# Patient Record
Sex: Female | Born: 1960 | Race: White | Hispanic: No | Marital: Married | Smoking: Never smoker
Health system: Southern US, Community
[De-identification: ages and names within clinical notes are randomized; demographics above are authoritative.]

## PROBLEM LIST (undated history)

## (undated) DIAGNOSIS — H9201 Otalgia, right ear: Secondary | ICD-10-CM

## (undated) DIAGNOSIS — C4491 Basal cell carcinoma of skin, unspecified: Secondary | ICD-10-CM

## (undated) DIAGNOSIS — K589 Irritable bowel syndrome without diarrhea: Secondary | ICD-10-CM

## (undated) DIAGNOSIS — R232 Flushing: Secondary | ICD-10-CM

## (undated) DIAGNOSIS — M1990 Unspecified osteoarthritis, unspecified site: Secondary | ICD-10-CM

## (undated) DIAGNOSIS — M75102 Unspecified rotator cuff tear or rupture of left shoulder, not specified as traumatic: Secondary | ICD-10-CM

## (undated) DIAGNOSIS — N200 Calculus of kidney: Secondary | ICD-10-CM

## (undated) DIAGNOSIS — A630 Anogenital (venereal) warts: Secondary | ICD-10-CM

## (undated) HISTORY — DX: Unspecified rotator cuff tear or rupture of left shoulder, not specified as traumatic: M75.102

## (undated) HISTORY — DX: Irritable bowel syndrome, unspecified: K58.9

## (undated) HISTORY — PX: APPENDECTOMY: SHX54

## (undated) HISTORY — DX: Basal cell carcinoma of skin, unspecified: C44.91

## (undated) HISTORY — DX: Flushing: R23.2

## (undated) HISTORY — DX: Otalgia, right ear: H92.01

## (undated) HISTORY — PX: WISDOM TOOTH EXTRACTION: SHX21

## (undated) HISTORY — PX: BASAL CELL CARCINOMA EXCISION: SHX1214

## (undated) HISTORY — PX: ABDOMINAL HYSTERECTOMY: SHX81

## (undated) HISTORY — DX: Anogenital (venereal) warts: A63.0

## (undated) HISTORY — DX: Unspecified osteoarthritis, unspecified site: M19.90

## (undated) HISTORY — DX: Calculus of kidney: N20.0

---

## 1999-03-20 ENCOUNTER — Encounter: Admission: RE | Admit: 1999-03-20 | Discharge: 1999-03-20 | Payer: Self-pay | Admitting: Obstetrics and Gynecology

## 1999-03-20 ENCOUNTER — Encounter: Payer: Self-pay | Admitting: Obstetrics and Gynecology

## 1999-03-27 ENCOUNTER — Encounter: Payer: Self-pay | Admitting: Obstetrics and Gynecology

## 1999-03-27 ENCOUNTER — Encounter: Admission: RE | Admit: 1999-03-27 | Discharge: 1999-03-27 | Payer: Self-pay | Admitting: Obstetrics and Gynecology

## 2000-04-19 HISTORY — PX: ABDOMINAL HYSTERECTOMY: SHX81

## 2000-04-19 HISTORY — PX: APPENDECTOMY: SHX54

## 2000-05-13 ENCOUNTER — Encounter: Admission: RE | Admit: 2000-05-13 | Discharge: 2000-05-13 | Payer: Self-pay | Admitting: Obstetrics and Gynecology

## 2000-05-13 ENCOUNTER — Encounter: Payer: Self-pay | Admitting: Obstetrics and Gynecology

## 2001-05-22 ENCOUNTER — Encounter: Admission: RE | Admit: 2001-05-22 | Discharge: 2001-05-22 | Payer: Self-pay | Admitting: Obstetrics and Gynecology

## 2001-05-22 ENCOUNTER — Encounter: Payer: Self-pay | Admitting: Obstetrics and Gynecology

## 2002-05-29 ENCOUNTER — Encounter: Admission: RE | Admit: 2002-05-29 | Discharge: 2002-05-29 | Payer: Self-pay | Admitting: Obstetrics and Gynecology

## 2002-05-29 ENCOUNTER — Encounter: Payer: Self-pay | Admitting: Obstetrics and Gynecology

## 2002-09-06 ENCOUNTER — Encounter (INDEPENDENT_AMBULATORY_CARE_PROVIDER_SITE_OTHER): Payer: Self-pay | Admitting: Specialist

## 2002-09-06 ENCOUNTER — Ambulatory Visit (HOSPITAL_BASED_OUTPATIENT_CLINIC_OR_DEPARTMENT_OTHER): Admission: RE | Admit: 2002-09-06 | Discharge: 2002-09-06 | Payer: Self-pay | Admitting: Obstetrics and Gynecology

## 2002-10-31 ENCOUNTER — Inpatient Hospital Stay (HOSPITAL_COMMUNITY): Admission: RE | Admit: 2002-10-31 | Discharge: 2002-11-02 | Payer: Self-pay | Admitting: Obstetrics and Gynecology

## 2002-10-31 ENCOUNTER — Encounter (INDEPENDENT_AMBULATORY_CARE_PROVIDER_SITE_OTHER): Payer: Self-pay | Admitting: Specialist

## 2003-07-25 ENCOUNTER — Ambulatory Visit (HOSPITAL_COMMUNITY): Admission: RE | Admit: 2003-07-25 | Discharge: 2003-07-25 | Payer: Self-pay | Admitting: Obstetrics and Gynecology

## 2004-08-10 ENCOUNTER — Ambulatory Visit (HOSPITAL_COMMUNITY): Admission: RE | Admit: 2004-08-10 | Discharge: 2004-08-10 | Payer: Self-pay | Admitting: Obstetrics and Gynecology

## 2005-08-16 ENCOUNTER — Ambulatory Visit (HOSPITAL_COMMUNITY): Admission: RE | Admit: 2005-08-16 | Discharge: 2005-08-16 | Payer: Self-pay | Admitting: Obstetrics and Gynecology

## 2006-08-23 ENCOUNTER — Ambulatory Visit (HOSPITAL_COMMUNITY): Admission: RE | Admit: 2006-08-23 | Discharge: 2006-08-23 | Payer: Self-pay | Admitting: Obstetrics and Gynecology

## 2007-09-18 ENCOUNTER — Ambulatory Visit (HOSPITAL_COMMUNITY): Admission: RE | Admit: 2007-09-18 | Discharge: 2007-09-18 | Payer: Self-pay | Admitting: Obstetrics and Gynecology

## 2007-09-26 ENCOUNTER — Encounter: Admission: RE | Admit: 2007-09-26 | Discharge: 2007-09-26 | Payer: Self-pay | Admitting: Obstetrics and Gynecology

## 2007-09-27 ENCOUNTER — Encounter: Admission: RE | Admit: 2007-09-27 | Discharge: 2007-09-27 | Payer: Self-pay | Admitting: Obstetrics and Gynecology

## 2007-09-27 ENCOUNTER — Encounter (INDEPENDENT_AMBULATORY_CARE_PROVIDER_SITE_OTHER): Payer: Self-pay | Admitting: Diagnostic Radiology

## 2008-04-19 HISTORY — PX: BASAL CELL CARCINOMA EXCISION: SHX1214

## 2008-09-23 ENCOUNTER — Ambulatory Visit (HOSPITAL_COMMUNITY): Admission: RE | Admit: 2008-09-23 | Discharge: 2008-09-23 | Payer: Self-pay | Admitting: Obstetrics and Gynecology

## 2009-04-19 LAB — HM COLONOSCOPY

## 2009-05-23 ENCOUNTER — Encounter: Admission: RE | Admit: 2009-05-23 | Discharge: 2009-05-23 | Payer: Self-pay | Admitting: Obstetrics and Gynecology

## 2009-06-16 LAB — HM PAP SMEAR: HM Pap smear: NORMAL

## 2009-10-29 ENCOUNTER — Encounter: Admission: RE | Admit: 2009-10-29 | Discharge: 2009-10-29 | Payer: Self-pay | Admitting: Obstetrics and Gynecology

## 2009-11-20 ENCOUNTER — Encounter: Admission: RE | Admit: 2009-11-20 | Discharge: 2009-11-20 | Payer: Self-pay | Admitting: Obstetrics and Gynecology

## 2009-11-20 LAB — HM MAMMOGRAPHY: HM Mammogram: NORMAL

## 2010-06-16 LAB — HM PAP SMEAR

## 2010-07-21 ENCOUNTER — Ambulatory Visit: Payer: Self-pay | Admitting: Internal Medicine

## 2010-08-13 ENCOUNTER — Encounter: Payer: Self-pay | Admitting: Internal Medicine

## 2010-08-13 ENCOUNTER — Ambulatory Visit (INDEPENDENT_AMBULATORY_CARE_PROVIDER_SITE_OTHER): Payer: BC Managed Care – PPO | Admitting: Internal Medicine

## 2010-08-13 DIAGNOSIS — M67919 Unspecified disorder of synovium and tendon, unspecified shoulder: Secondary | ICD-10-CM

## 2010-08-13 DIAGNOSIS — Z79899 Other long term (current) drug therapy: Secondary | ICD-10-CM

## 2010-08-13 DIAGNOSIS — M75102 Unspecified rotator cuff tear or rupture of left shoulder, not specified as traumatic: Secondary | ICD-10-CM

## 2010-08-13 DIAGNOSIS — Z23 Encounter for immunization: Secondary | ICD-10-CM

## 2010-08-13 DIAGNOSIS — E785 Hyperlipidemia, unspecified: Secondary | ICD-10-CM

## 2010-08-13 DIAGNOSIS — C4491 Basal cell carcinoma of skin, unspecified: Secondary | ICD-10-CM | POA: Insufficient documentation

## 2010-08-13 DIAGNOSIS — Z0000 Encounter for general adult medical examination without abnormal findings: Secondary | ICD-10-CM

## 2010-08-13 HISTORY — DX: Unspecified rotator cuff tear or rupture of left shoulder, not specified as traumatic: M75.102

## 2010-08-13 HISTORY — DX: Basal cell carcinoma of skin, unspecified: C44.91

## 2010-08-13 LAB — HEPATIC FUNCTION PANEL
ALT: 31 U/L (ref 0–35)
AST: 26 U/L (ref 0–37)
Albumin: 3.8 g/dL (ref 3.5–5.2)
Alkaline Phosphatase: 44 U/L (ref 39–117)
Bilirubin, Direct: 0.1 mg/dL (ref 0.0–0.3)
Total Bilirubin: 0.5 mg/dL (ref 0.3–1.2)
Total Protein: 6.4 g/dL (ref 6.0–8.3)

## 2010-08-13 LAB — BASIC METABOLIC PANEL
BUN: 17 mg/dL (ref 6–23)
CO2: 30 mEq/L (ref 19–32)
Calcium: 9.2 mg/dL (ref 8.4–10.5)
Chloride: 104 mEq/L (ref 96–112)
Creatinine, Ser: 0.6 mg/dL (ref 0.4–1.2)
GFR: 123.86 mL/min (ref >60.00)
Glucose, Bld: 84 mg/dL (ref 70–99)
Potassium: 4.2 mEq/L (ref 3.5–5.1)
Sodium: 140 mEq/L (ref 135–145)

## 2010-08-13 LAB — LIPID PANEL
Cholesterol: 220 mg/dL — ABNORMAL HIGH (ref 0–200)
HDL: 75.7 mg/dL (ref >39.00)
Total CHOL/HDL Ratio: 3
Triglycerides: 88 mg/dL (ref 0.0–149.0)
VLDL: 17.6 mg/dL (ref 0.0–40.0)

## 2010-08-13 LAB — LDL CHOLESTEROL, DIRECT: Direct LDL: 133.3 mg/dL

## 2010-08-13 NOTE — Assessment & Plan Note (Signed)
Stable. No evidence recurrence. Continue dermatology surveillance

## 2010-08-13 NOTE — Assessment & Plan Note (Signed)
Improved

## 2010-08-13 NOTE — Progress Notes (Signed)
  Subjective:    Patient ID: Jordan Khan, female    DOB: 22-Apr-1960, 50 y.o.   MRN: [id number]  HPI Pt presents to clinic to establish primary care and followup of hyperlipidemia. Has no history of mild hyperlipidemia not requiring medication in the past. Last results unavailable but had improved from previous. History of intermittent headaches with one severe in the past requiring CT angiogram dated July 2001. Result was negative. Has only mild intermittent headaches for which he takes ibuprofen without GI adverse effect. History of left rotator syndrome status post physical therapy with improvement. Sees gynecology for Pap smears up-to-date and normal. History of basal cell carcinoma status post Mohs surgery with regular dermatology surveillance currently every six months. No complaints.  Review Passman of history, past surgical history, medications, allergies, social history and family history.  Review of Systems  Constitutional: Negative.   Genitourinary: Negative.   Musculoskeletal: Positive for arthralgias.  Neurological: Positive for headaches.  Hematological: Negative.   Psychiatric/Behavioral: Negative.   All other systems reviewed and are negative.       Objective:   Physical Exam Physical Exam  [nursing notereviewed. Constitutional:  appears well-developed and well-nourished. No distress.  HENT:  Head: Normocephalic and atraumatic.  Right Ear: Tympanic membrane, external ear and ear canal normal.  Left Ear: Tympanic membrane, external ear and ear canal normal.  Nose: Nose normal.  Neck: Supple nontender no adenopathy. No carotid bruits or thyromegaly/thyroid nodules appreciated Mouth/Throat: Oropharynx is clear and moist. No oropharyngeal exudate.  Eyes: Conjunctivae are normal. No scleral icterus.  Neck: Neck supple.  Cardiovascular: Normal rate, regular rhythm and normal heart sounds.  Exam reveals no gallop and no friction rub.   No murmur heard. Pulmonary/Chest:  Effort normal and breath sounds normal. No respiratory distress.  no wheezes.  no rales.  Abdomen: Soft, nondistended, nontender, positive bowel sounds. No masses or organomegaly appreciated. Lymphadenopathy:     no cervical adenopathy.  Neurological:  alert.  Skin: Skin is warm and dry.  not diaphoretic.         Assessment & Plan:

## 2010-08-13 NOTE — Assessment & Plan Note (Signed)
Obtain fasting profile and liver function test. Discussed low-fat diet and regular aerobic exercise thirty minutes at least four days a week.

## 2010-08-20 ENCOUNTER — Telehealth: Payer: Self-pay

## 2010-08-20 NOTE — Telephone Encounter (Signed)
Message copied by Kyung Rudd on Thu Aug 20, 2010  1:54 PM ------      Message from: Letitia Libra, Maisie Fus      Created: Thu Aug 20, 2010 10:51 AM       Chol only mildly elevated. Do not recommend medication. Low fat diet and regular aerobic exercise

## 2010-09-04 NOTE — H&P (Signed)
Jordan Khan, Jordan Khan                      ACCOUNT NO.:  [id number]   MEDICAL RECORD NO.:  [id number]                   PATIENT TYPE:  INP   LOCATION:  NA                                   FACILITY:  [facility]   PHYSICIAN:  Katherine Roan, M.D.               DATE OF BIRTH:  26-Feb-1961   DATE OF ADMISSION:  DATE OF DISCHARGE:                                HISTORY & PHYSICAL   CHIEF COMPLAINT:  Elective admission.   HISTORY OF PRESENT ILLNESS:  Jordan Khan is a 50 year old female who  presents to the hospital for hysterectomy.  She is a G3, P3 female.  She was  seen in March of 2004 complaining of increased pain and bad menstrual  cramps.  Birth control pills were given, and she continued to have  premenstrual pain.  A laparoscopy was recommended and was performed  revealing endometriosis, diffuse pelvic endometriosis, with endometrioma of  the right ovary and the appendix that was stuck to the right ovarian  endometrioma.  We recommended at that time hysterectomy with removal of the  right tube and ovary and possible ovarian conservation.  She has considered  this, and after informed consent has decided to proceed.  She had three  uncomplicated pregnancies.  She is on Yasmin for control of heavy periods,  and she is on no medications.   REVIEW OF SYSTEMS:  HEENT:  She wears contacts, but her audio or visual  acuity has not changed.  No headaches or dizziness.  HEART:  No rheumatic  fever.  No history of heart murmur.  No history of mitral valve prolapse.  LUNGS:  No chronic cough, no asthma.  GU:  She denies stress incontinence.  Denies history of UTI's.  GI:  She has irritable bowel syndrome, but no  history of ulcers, no history of melena, no weight loss or gain, no  anorexia.   SOCIAL HISTORY:  She drinks alcohol socially.  Does not smoke.   FAMILY HISTORY:  Her mother is 72 years old with hypertension.  Her father  is 58 with some form of heart disease.  She has five  sisters who are in good  health.   PHYSICAL EXAMINATION:  GENERAL:  A well-developed and nourished female who  appears to be her stated age.  VITAL SIGNS:  Her blood pressure is 110/80, weight 142.  HEENT:  Unremarkable.  Oropharynx is not injected.  NECK:  Supple.  Thyroid is not enlarged.  Carotid pulses are equal without  bruits.  BREASTS:  No masses or tenderness.  Axilla free from adenopathy.  LUNGS:  Clear to percussion and auscultation.  HEART:  Normal sinus rhythm.  No murmurs.  ABDOMEN:  Soft and flat.  Liver, spleen, and kidneys are not palpated.  Bowel sounds are normal.  No tenderness.  No bruits heard.  Femoral pulses  are equal.  EXTREMITIES:  Good range of motion, equal pulses and reflexes.  PELVIC:  A clean cervix.  Uterus is anterior.  There is fullness in the  right adnexa and tenderness.  RECTOVAGINAL:  Confirmed.   IMPRESSION:  Endometriosis with pelvic pain.    PLAN:  Hysterectomy with removal of right ovary and appendix.  Informed  consent has been given to the patient and risks have been discussed that  were not limited to, but including, infection, hemorrhage, damage to bladder  and bowel.  Jordan Khan has made this decision, has watched the informed  consent movie, and is agreeable to proceed.  She will be on mechanical bowel  prep.                                               Katherine Roan, M.D.    SDM/MEDQ  D:  10/29/2002  T:  10/29/2002  Job:  [id number]

## 2010-09-04 NOTE — Op Note (Signed)
   NAMEKLA, BILY                      ACCOUNT NO.:  [id number]   MEDICAL RECORD NO.:  [id number]                   PATIENT TYPE:  AMB   LOCATION:  [facility]                                 FACILITY:  [facility]   PHYSICIAN:  Katherine Roan, M.D.               DATE OF BIRTH:  04/14/1961   DATE OF PROCEDURE:  09/06/2002  DATE OF DISCHARGE:                                 OPERATIVE REPORT   PREOPERATIVE DIAGNOSIS:  Pelvic pain.   POSTOPERATIVE DIAGNOSIS:  Stage 3 endometriosis.   OPERATION PERFORMED:  Pelvic exam under anesthesia, diagnostic laparoscopy  and hysteroscopy.   DESCRIPTION OF PROCEDURE:  The patient was placed in lithotomy position,  prepped and draped in the usual fashion. Exam under anesthesia revealed a  retroverted uterus, I felt no nodularity.   A transverse incision was made in the umbilicus and the Veress needle was  inserted, double click heard, aspiration infusion accomplished and the  abdomen was distended with carbon dioxide. While this was being  accomplished, the Hulka elevator was inserted into a retroverted uterus.  Visualization of the pelvis revealed diffuse endometriosis in the cul-de-sac  anteriorly on the left ovary and an endometrioma of the right ovary  involving the appendix. It was markedly adherent to the pelvic sidewall.   We reviewed the right upper quadrant and liver appeared to be normal. No  unusual changes on the bowel. Gas was evacuated, skin incisions were closed  with #0 Vicryl with a UR6 needle following which I used a 3-0 plain for the  subcutaneum and injected the two incisions, one in the midline low for  manipulation and the incision in the umbilicus for the camera was closed  with 3-0 plain. Following this, we went down below and hysteroscoped the  patient. There was diffuse bleeding in the endometrial cavity but no gross  submucous fibroid or polyps were noted, a D&C was performed. The patient was  awakened, carried to the  recovery room in good condition.                                               Katherine Roan, M.D.    SDM/MEDQ  D:  09/06/2002  T:  09/06/2002  Job:  [id number]

## 2010-09-04 NOTE — Op Note (Signed)
[name], Jordan Khan                      ACCOUNT NO.:  [id number]   MEDICAL RECORD NO.:  [id number]                   PATIENT TYPE:  INP   LOCATION:  X003                                 FACILITY:  [facility]   PHYSICIAN:  Katherine Roan, M.D.               DATE OF BIRTH:  1960/07/18   DATE OF PROCEDURE:  10/31/2002  DATE OF DISCHARGE:                                 OPERATIVE REPORT   PREOPERATIVE DIAGNOSIS:  Endometriosis.   POSTOPERATIVE DIAGNOSIS:  Endometriosis.   OPERATION:  Pelvic exam under anesthesia.  Excision of endometriosis of the  left infundibulopelvic ligament.  Total abdominal hysterectomy.  Right  salpingo-oophorectomy.  Exploratory laparotomy.   PROCEDURE:  The patient was anesthetized, and pelvic exam under anesthesia  was accomplished.  The uterus was normal size and shape.  I was unable to  feel any masses.  The uterus was retroverted.  After prep was accomplished,  the Foley catheter was inserted.  The abdomen was entered through a  transverse approach.  Hemostasis was accomplished with the Bovie.  The  peritoneum was entered vertically, and the exploration of the upper abdomen  revealed a normal liver.  The right lobe of the liver extended down just  under the subcostal margins.  Both kidneys were normal.  The stomach was  normal.  No paraortic adenopathy was appreciated.  The abdominal viscera  were then packed away from the pelvic viscera.  The uterus contained an  endometrioma in the right ovary and was stuck to the pelvic side wall and  was adherent to the appendix.  There was also endometriosis in the left  infundibulopelvic ligament.  The left ovary appeared to be relatively free.  The uterus was then elevated.  The right ovary was freed.  The appendix was  dissected from the endometrioma.  The infundibulopelvic ligament on the  right was skeletonized free from the ureter and ligated with two sutures of  0 chromic.  The round ligaments on each side  were suture ligated.  Bladder  flap was created.  The uterine vessels were skeletonized and ligated.  The  cardinal and uterosacral ligaments were clamped in succession and ligated.  The angles of the vagina were entered, and the specimen was removed from the  operative field, consisting of the uterus, cervix, and right ovary and tube.  The vagina was then closed with horizontal mattress sutures of 0 chromic and  0 Vicryl, and the culdoplasty was performed using interrupted sutures of 0  Vicryl.  This afforded good vault support.  I then reperitonealized the area  with interrupted sutures of 3-0 Vicryl.  The pelvis was washed with copious  amounts of saline.  I delivered the cecum into the abdominal wall and  clamped the mesoappendix and ligated this with 3-0 Vicryl.  The base of the  appendix was crushed, clamped, and ligated with two 0 chromic sutures, and  the base of the  appendix was inverted with a 3-0 pursestring suture.  Hemostasis was secured.  I then irrigated copious amounts of saline in the  pelvis.  The parietal peritoneum was closed with 2-0 PDS.  The fascia was  closed with 2-0 PDS and interrupted 0 Vicryl.  The subcutaneous was closed  with 1 PDS and a subcuticular suture of 3-0 plain.  Hemostasis was secure.  Prior to opening the abdomen, we had infiltrated the incision with about 20  mL of 0.5% Marcaine with epinephrine.  The patient tolerated the procedure  well.  The estimated blood loss was about 200 mL.                                               Katherine Roan, M.D.    SDM/MEDQ  D:  10/31/2002  T:  10/31/2002  Job:  [id number]

## 2010-09-04 NOTE — Discharge Summary (Signed)
   NAMEDEMESHIA, Jordan Khan                      ACCOUNT NO.:  [id number]   MEDICAL RECORD NO.:  [id number]                   PATIENT TYPE:  INP   LOCATION:  0448                                 FACILITY:  [facility]   PHYSICIAN:  Katherine Roan, M.D.               DATE OF BIRTH:  [birth year]   DATE OF ADMISSION:  10/31/2002  DATE OF DISCHARGE:  11/02/2002                                 DISCHARGE SUMMARY   ADMISSION DIAGNOSES:  Endometriosis.   DISCHARGE DIAGNOSES:  Endometriosis.   BRIEF HISTORY:  The patient is a 50 year old female with documented  endometriosis who was admitted to the hospital for surgery.  Detailed  informed consent had been given to the patient and a physical examination  was unremarkable.   LABORATORIES:  Hemoglobin 13, hematocrit 38.  Pregnancy test negative.   HOSPITAL COURSE:  The patient was admitted to the hospital and underwent an  uneventful pelvic examination under anesthesia, exploratory laparotomy,  excision of endometriosis of left infundibulopelvic ligament, total  abdominal hysterectomy, removal of right ovary and tube, and appendectomy.  Pathologically there was endometriosis in the left IP ligament along with  the right ovary and appendix.  The uterus had adenomyosis.  Her  postoperative course was uncomplicated.  She was afebrile and without  complaints.  Was discharged on July 16 to home and office care.  She was  told to call for fever, bleeding, or any other difficulty.   CONDITION ON DISCHARGE:  Improved.                                               Katherine Roan, M.D.    SDM/MEDQ  D:  11/15/2002  T:  11/15/2002  Job:  [id number]

## 2010-09-11 ENCOUNTER — Telehealth: Payer: Self-pay | Admitting: Internal Medicine

## 2010-09-11 MED ORDER — ONDANSETRON HCL 4 MG PO TABS: 4.0000 mg | ORAL_TABLET | Freq: Three times a day (TID) | ORAL | Status: AC | PRN

## 2010-09-11 NOTE — Telephone Encounter (Signed)
zofran 4mg  po tid prn nausea #20 if no interactions. Avoid high fat content food.

## 2010-09-11 NOTE — Telephone Encounter (Signed)
Dicyclomine is not helping with nausea for ibs. Pt is wondering if there is another med to help with nausea? Also pt is wondering what food she can eat, that would help with nausea but not make her constipated? Pls call.

## 2010-09-12 ENCOUNTER — Emergency Department (INDEPENDENT_AMBULATORY_CARE_PROVIDER_SITE_OTHER): Payer: BC Managed Care – PPO

## 2010-09-12 ENCOUNTER — Emergency Department (HOSPITAL_BASED_OUTPATIENT_CLINIC_OR_DEPARTMENT_OTHER)
Admission: EM | Admit: 2010-09-12 | Discharge: 2010-09-12 | Disposition: A | Discharge status: Home / Self Care | Payer: BC Managed Care – PPO | Attending: Emergency Medicine | Admitting: Emergency Medicine

## 2010-09-12 DIAGNOSIS — N200 Calculus of kidney: Secondary | ICD-10-CM | POA: Insufficient documentation

## 2010-09-12 DIAGNOSIS — R112 Nausea with vomiting, unspecified: Secondary | ICD-10-CM

## 2010-09-12 DIAGNOSIS — R109 Unspecified abdominal pain: Secondary | ICD-10-CM

## 2010-09-12 DIAGNOSIS — I998 Other disorder of circulatory system: Secondary | ICD-10-CM | POA: Insufficient documentation

## 2010-09-12 DIAGNOSIS — E860 Dehydration: Secondary | ICD-10-CM | POA: Insufficient documentation

## 2010-09-12 DIAGNOSIS — Z9089 Acquired absence of other organs: Secondary | ICD-10-CM

## 2010-09-12 DIAGNOSIS — Z90710 Acquired absence of both cervix and uterus: Secondary | ICD-10-CM

## 2010-09-12 LAB — URINALYSIS, ROUTINE W REFLEX MICROSCOPIC
Bilirubin Urine: NEGATIVE
Glucose, UA: NEGATIVE mg/dL
Ketones, ur: 15 mg/dL — AB
Leukocytes, UA: NEGATIVE
Nitrite: NEGATIVE
Protein, ur: 30 mg/dL — AB
Specific Gravity, Urine: 1.023 (ref 1.005–1.030)
Urobilinogen, UA: 1 mg/dL (ref 0.0–1.0)
pH: 6.5 (ref 5.0–8.0)

## 2010-09-12 LAB — COMPREHENSIVE METABOLIC PANEL
ALT: 22 U/L (ref 0–35)
AST: 19 U/L (ref 0–37)
Albumin: 4.3 g/dL (ref 3.5–5.2)
Alkaline Phosphatase: 48 U/L (ref 39–117)
BUN: 11 mg/dL (ref 6–23)
CO2: 24 mEq/L (ref 19–32)
Calcium: 9.9 mg/dL (ref 8.4–10.5)
Chloride: 99 mEq/L (ref 96–112)
Creatinine, Ser: 0.5 mg/dL (ref 0.4–1.2)
GFR calc Af Amer: >60 mL/min (ref >60)
GFR calc non Af Amer: >60 mL/min (ref >60)
Glucose, Bld: 125 mg/dL — ABNORMAL HIGH (ref 70–99)
Potassium: 3.6 mEq/L (ref 3.5–5.1)
Sodium: 136 mEq/L (ref 135–145)
Total Bilirubin: 0.5 mg/dL (ref 0.3–1.2)
Total Protein: 7.6 g/dL (ref 6.0–8.3)

## 2010-09-12 LAB — CBC
HCT: 43.3 % (ref 36.0–46.0)
Hemoglobin: 15.2 g/dL — ABNORMAL HIGH (ref 12.0–15.0)
MCH: 32.2 pg (ref 26.0–34.0)
MCHC: 35.1 g/dL (ref 30.0–36.0)
MCV: 91.7 fL (ref 78.0–100.0)
Platelets: 366 10*3/uL (ref 150–400)
RBC: 4.72 MIL/uL (ref 3.87–5.11)
RDW: 12.6 % (ref 11.5–15.5)
WBC: 11 10*3/uL — ABNORMAL HIGH (ref 4.0–10.5)

## 2010-09-12 LAB — DIFFERENTIAL
Basophils Absolute: 0 10*3/uL (ref 0.0–0.1)
Basophils Relative: 0 % (ref 0–1)
Eosinophils Absolute: 0 10*3/uL (ref 0.0–0.7)
Eosinophils Relative: 0 % (ref 0–5)
Lymphocytes Relative: 12 % (ref 12–46)
Lymphs Abs: 1.3 10*3/uL (ref 0.7–4.0)
Monocytes Absolute: 0.8 10*3/uL (ref 0.1–1.0)
Monocytes Relative: 7 % (ref 3–12)
Neutro Abs: 8.9 10*3/uL — ABNORMAL HIGH (ref 1.7–7.7)
Neutrophils Relative %: 81 % — ABNORMAL HIGH (ref 43–77)

## 2010-09-12 LAB — URINE MICROSCOPIC-ADD ON

## 2010-09-12 LAB — LIPASE, BLOOD: Lipase: 19 U/L (ref 11–59)

## 2010-09-12 MED ORDER — IOHEXOL 300 MG/ML  SOLN: 100.0000 mL | Freq: Once | INTRAMUSCULAR | Status: DC | PRN

## 2010-09-15 ENCOUNTER — Telehealth: Payer: Self-pay | Admitting: *Deleted

## 2010-09-15 NOTE — Telephone Encounter (Signed)
GI virus continues after trip to ER with 3 liters of fluids.  Still cannot eat normal foods.  Appt made.

## 2010-09-16 ENCOUNTER — Ambulatory Visit (INDEPENDENT_AMBULATORY_CARE_PROVIDER_SITE_OTHER): Payer: BC Managed Care – PPO | Admitting: Internal Medicine

## 2010-09-16 ENCOUNTER — Encounter: Payer: Self-pay | Admitting: Internal Medicine

## 2010-09-16 VITALS — BP 120/84 | HR 90 | Temp 97.8°F | Wt 134.0 lb

## 2010-09-16 DIAGNOSIS — E860 Dehydration: Secondary | ICD-10-CM

## 2010-09-16 DIAGNOSIS — R109 Unspecified abdominal pain: Secondary | ICD-10-CM

## 2010-09-16 DIAGNOSIS — N200 Calculus of kidney: Secondary | ICD-10-CM

## 2010-09-16 LAB — POCT URINALYSIS DIPSTICK
Glucose, UA: NEGATIVE
Leukocytes, UA: NEGATIVE
Nitrite, UA: NEGATIVE
Spec Grav, UA: 1.025
Urobilinogen, UA: 2
pH, UA: 5.5

## 2010-09-16 MED ORDER — CIPROFLOXACIN HCL 500 MG PO TABS: 500.0000 mg | ORAL_TABLET | Freq: Two times a day (BID) | ORAL | Status: AC

## 2010-09-17 ENCOUNTER — Encounter: Payer: Self-pay | Admitting: Internal Medicine

## 2010-09-18 ENCOUNTER — Ambulatory Visit (INDEPENDENT_AMBULATORY_CARE_PROVIDER_SITE_OTHER): Payer: BC Managed Care – PPO | Admitting: Internal Medicine

## 2010-09-18 ENCOUNTER — Encounter: Payer: Self-pay | Admitting: Internal Medicine

## 2010-09-18 VITALS — HR 108 | Temp 98.4°F

## 2010-09-18 DIAGNOSIS — N200 Calculus of kidney: Secondary | ICD-10-CM

## 2010-09-18 NOTE — Assessment & Plan Note (Signed)
Improving. Continue to increase po fluid intake and slowly advance solid diet. Continue abx to completion.

## 2010-09-18 NOTE — Progress Notes (Signed)
  Subjective:    Patient ID: Jordan Khan, female    DOB: 05-05-1960, 50 y.o.   MRN: [id number]  HPI Pt presents to clinic for close followup of possible nephrolithiasis and UTI. Seen recently after ED visit with CT findings of multiple small nonobstructing kidney stones. Had intermittent nausea and malaise. Felt to be volume deplete and received IVF 1 Liter D5NS. Reports overall improvement. Denies f/c, abd pain, n/v. Advancing diet now without difficultly. Tolerating abx without adverse effect.   Total time of appointment ~ 25 minutes of which >50% was spent in counseling.    Review of Systems see hpi     Objective:   Physical Exam  [nursing notereviewed. Constitutional: She appears well-developed and well-nourished. No distress.  HENT:  Head: Normocephalic and atraumatic.  Neurological: She is alert.  Skin: She is not diaphoretic.  Psychiatric: She has a normal mood and affect.          Assessment & Plan:

## 2010-09-19 NOTE — Assessment & Plan Note (Signed)
Felt to be volume depleted. Peripheral IV placed without complication and 1 L of D5 normal saline provided. Continue frequent small amounts of p.o. Fluid. Schedule followup in 48 hours or sooner if necessary

## 2010-09-19 NOTE — Assessment & Plan Note (Signed)
Multiple stones but nonobstructing. Continue antimanic p.r.n. Concerned over potential secondary UTI. Begin Cipro. Schedule close followup as above.

## 2010-09-19 NOTE — Progress Notes (Signed)
  Subjective:    Patient ID: Jordan Khan, female    DOB: 11-18-1960, 50 y.o.   MRN: [id number]  HPI Patient presents to clinic for evaluation of nausea. Recently seen in the emergency department with nausea and vomiting. Laboratory evaluation unrevealing with the exception of minimal leukocytosis and urinalysis with bacteria and RBCs. Abdominal pelvic CT demonstrated several small stones with the largest being 4 mm and nonobstructing. Was given IV fluid and Phenergan. Presents today with decreased appetite, decreased nausea, persistent weakness. Overall has decreased p.o. Intake. No fever chills, significant abdominal pain emesis diarrhea or blood in stool. There is questionable urinary hesitancy. No alleviating or exacerbating factors. No other complaints.  Reviewed past medical history, medications and allergies    Review of Systems see history of present illness    Objective:   Physical Exam  Nursing note and vitals reviewed. Constitutional: She appears well-developed. She appears lethargic. No distress.  HENT:  Head: Normocephalic and atraumatic.  Right Ear: External ear normal.  Left Ear: External ear normal.  Nose: Nose normal.  Mouth/Throat: Oropharynx is clear and moist.  Eyes: Conjunctivae are normal. No scleral icterus.  Neck: Neck supple.  Abdominal: Soft. Bowel sounds are normal. She exhibits no distension and no mass. There is no tenderness. There is no rebound and no guarding.  Neurological: She appears lethargic. Coordination and gait normal.  Skin: Skin is warm and dry. No rash noted. She is not diaphoretic. No erythema.  Psychiatric: She has a normal mood and affect.          Assessment & Plan:

## 2010-10-29 ENCOUNTER — Encounter: Payer: Self-pay | Admitting: Family Medicine

## 2010-11-12 ENCOUNTER — Telehealth: Payer: Self-pay | Admitting: Internal Medicine

## 2010-11-12 NOTE — Telephone Encounter (Signed)
Patient states that she is over 49 now and would like a colonoscopy. Pt would like to go to [facility] and would like Dr. Ilda Foil opinion on which doctor to go to there and if she needs a referral.  Pt is looking at  Dr. Rodena Piety Dr. Zollie Pee Dr. Darryl Lent.

## 2010-11-12 NOTE — Telephone Encounter (Signed)
Noe Gens is a gastroenterologist. i believe the other two are pa's in the clinic. Let me know if she's ready for referral

## 2010-11-13 NOTE — Telephone Encounter (Signed)
Call placed to patient at [phone], she was informed per Dr Rodena Medin instruction. She states that she does not believe she needs a referral. She was wanting to make sure that Dr Rodena Medin was okay with the physician she is considering scheduling the colonoscopy with.

## 2010-12-02 ENCOUNTER — Other Ambulatory Visit: Payer: Self-pay | Admitting: Internal Medicine

## 2010-12-29 ENCOUNTER — Other Ambulatory Visit: Payer: Self-pay | Admitting: Obstetrics and Gynecology

## 2010-12-29 DIAGNOSIS — Z1231 Encounter for screening mammogram for malignant neoplasm of breast: Secondary | ICD-10-CM

## 2011-01-07 ENCOUNTER — Ambulatory Visit: Payer: BC Managed Care – PPO

## 2011-01-11 ENCOUNTER — Ambulatory Visit
Admission: RE | Admit: 2011-01-11 | Discharge: 2011-01-11 | Disposition: A | Discharge status: Home / Self Care | Payer: BC Managed Care – PPO | Source: Ambulatory Visit | Attending: Obstetrics and Gynecology | Admitting: Obstetrics and Gynecology

## 2011-01-11 DIAGNOSIS — Z1231 Encounter for screening mammogram for malignant neoplasm of breast: Secondary | ICD-10-CM

## 2011-08-17 ENCOUNTER — Ambulatory Visit: Payer: BC Managed Care – PPO | Admitting: Internal Medicine

## 2012-02-03 ENCOUNTER — Other Ambulatory Visit: Payer: Self-pay | Admitting: Obstetrics and Gynecology

## 2012-02-03 DIAGNOSIS — Z1231 Encounter for screening mammogram for malignant neoplasm of breast: Secondary | ICD-10-CM

## 2012-03-08 ENCOUNTER — Ambulatory Visit: Payer: BC Managed Care – PPO

## 2012-03-08 ENCOUNTER — Ambulatory Visit
Admission: RE | Admit: 2012-03-08 | Discharge: 2012-03-08 | Disposition: A | Discharge status: Home / Self Care | Payer: BC Managed Care – PPO | Source: Ambulatory Visit | Attending: Obstetrics and Gynecology | Admitting: Obstetrics and Gynecology

## 2012-03-08 DIAGNOSIS — Z1231 Encounter for screening mammogram for malignant neoplasm of breast: Secondary | ICD-10-CM

## 2012-04-18 ENCOUNTER — Ambulatory Visit (INDEPENDENT_AMBULATORY_CARE_PROVIDER_SITE_OTHER): Payer: BC Managed Care – PPO | Admitting: Internal Medicine

## 2012-04-18 VITALS — BP 98/66 | HR 68 | Temp 98.2°F | Resp 14 | Wt 146.0 lb

## 2012-04-18 DIAGNOSIS — H9201 Otalgia, right ear: Secondary | ICD-10-CM

## 2012-04-18 DIAGNOSIS — H9209 Otalgia, unspecified ear: Secondary | ICD-10-CM

## 2012-04-22 ENCOUNTER — Encounter: Payer: Self-pay | Admitting: Internal Medicine

## 2012-04-22 DIAGNOSIS — H9201 Otalgia, right ear: Secondary | ICD-10-CM | POA: Insufficient documentation

## 2012-04-22 NOTE — Assessment & Plan Note (Signed)
Discussed possible etiologies including ear effusion/allergic etiology and mild intermittent TMJ syndrome. Attempt otc AH/decongestant and continue nsaid prn.

## 2012-04-22 NOTE — Progress Notes (Signed)
  Subjective:    Patient ID: Jordan Khan, female    DOB: 03-14-1961, 52 y.o.   MRN: [id number]  HPI Pt presents to clinic for evaluation of ear pain. Notes chronic intermittent right ear pain that typically occurs in the fall. May improve with ibuprofen. Has been evaluated by ent in the past. No alleviating or exacerbating factors. No pain with jaw movement.  Past Medical History  Diagnosis Date  . Arthritis   . Genital warts   . IBS (irritable bowel syndrome)    Past Surgical History  Procedure Date  . Abdominal hysterectomy   . Appendectomy   . Basal cell carcinoma excision     reports that she has never smoked. She does not have any smokeless tobacco history on file. She reports that she drinks alcohol. She reports that she does not use illicit drugs. family history includes Alcohol abuse in her maternal grandfather; Arthritis in her father; Heart disease in her father and paternal grandfather; Hyperlipidemia in her father and sister; Hypertension in her mother and sister; and Stroke in her maternal grandmother and paternal grandmother. No Known Allergies   Review of Systems see hpi     Objective:   Physical Exam  Nursing note and vitals reviewed. Constitutional: She appears well-developed and well-nourished. No distress.  HENT:  Head: Normocephalic and atraumatic.  Right Ear: External ear and ear canal normal. A middle ear effusion is present.  Left Ear: Tympanic membrane, external ear and ear canal normal.  Nose: Nose normal.  Mouth/Throat: Oropharynx is clear and moist. No oropharyngeal exudate.       Mild intermittent click with right jaw movement. No pain or hesitation.  Skin: She is not diaphoretic.          Assessment & Plan:

## 2012-05-25 IMAGING — CT CT ABD-PELV W/O CM
2 of 4 series · 16 of 46 positions shown, 18 images · non-contrast
Comparison: Radiographs obtained earlier today.

CLINICAL DATA: Right abdominal pain, nausea and vomiting for the
past 3 days.  Previous appendectomy and partial hysterectomy.
History of irritable bowel syndrome.

CT ABDOMEN AND PELVIS WITHOUT CONTRAST
TECHNIQUE: Multidetector CT imaging of the abdomen and pelvis was
performed following the standard protocol without intravenous
contrast.

[Series 2: renal stone < 200 lbs 5.0 b31f · axial · 0.69mm/px · z∈[+668,+1068]mm · 13 of 88 slices shown, 15 images]
[im 4/88  soft-tissue]
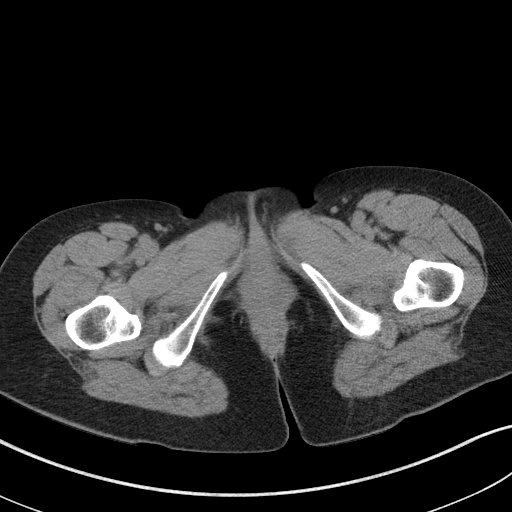
[im 4/88  bone]
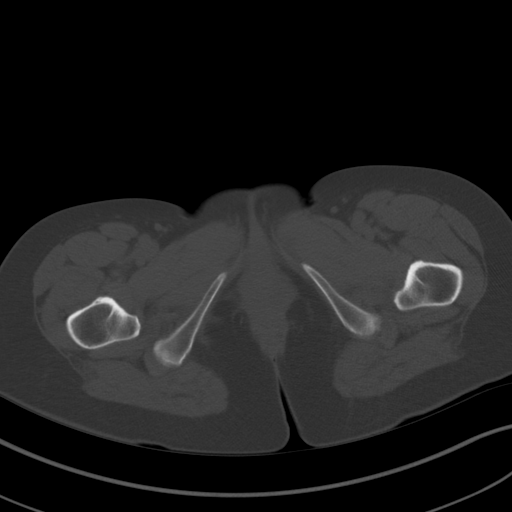
[im 11/88  soft-tissue]
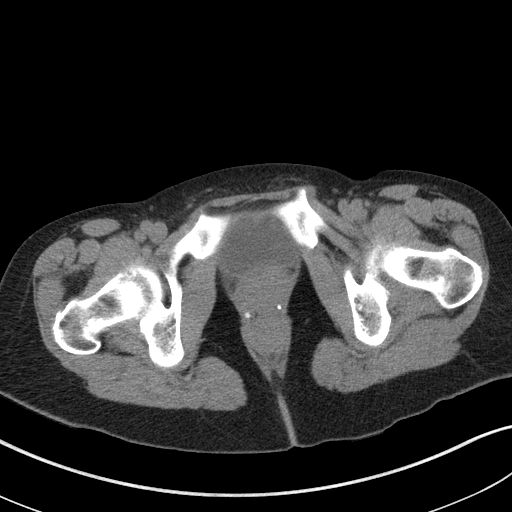
[im 17/88  soft-tissue]
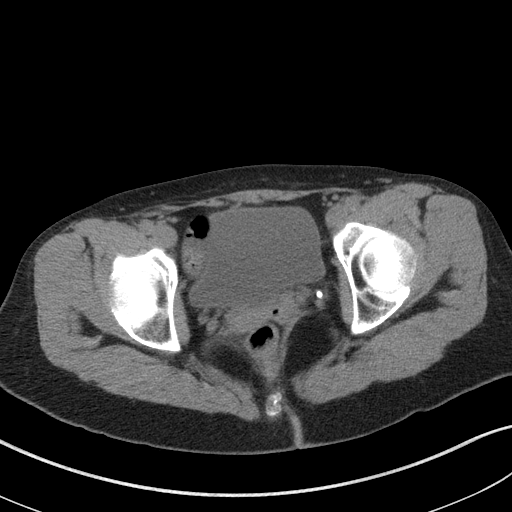
[im 24/88  soft-tissue]
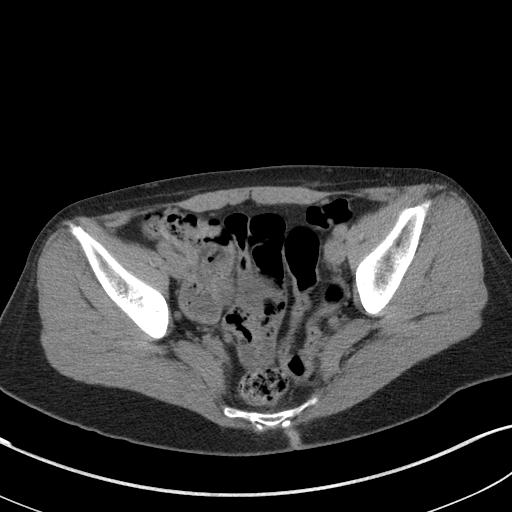
[im 31/88  soft-tissue]
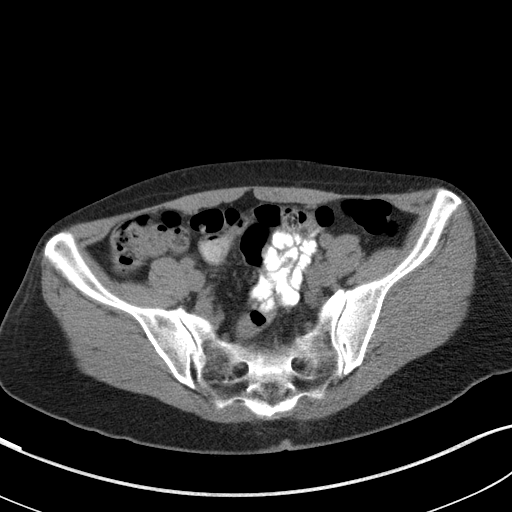
[im 37/88  soft-tissue]
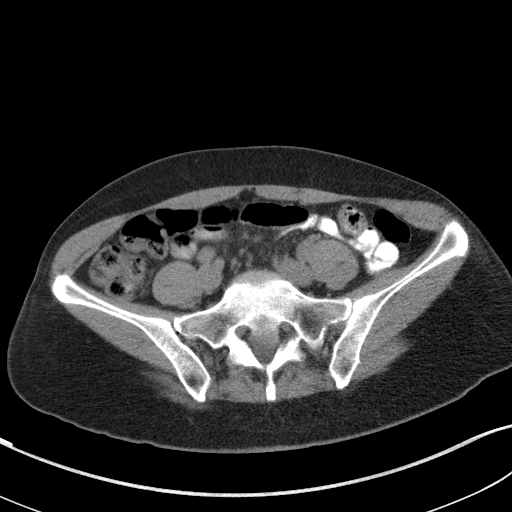
[im 44/88  soft-tissue]
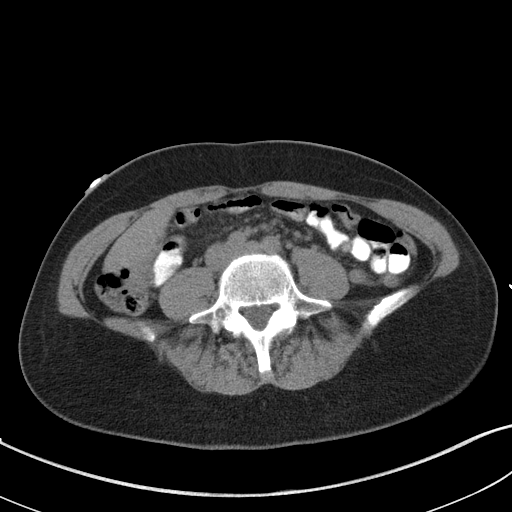
[im 51/88  soft-tissue]
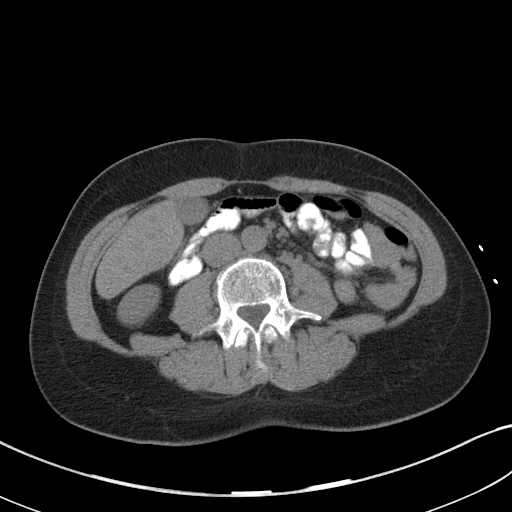
[im 57/88  soft-tissue]
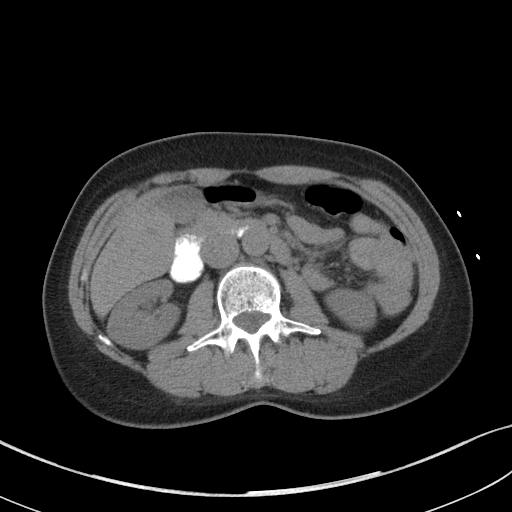
[im 57/88  bone]
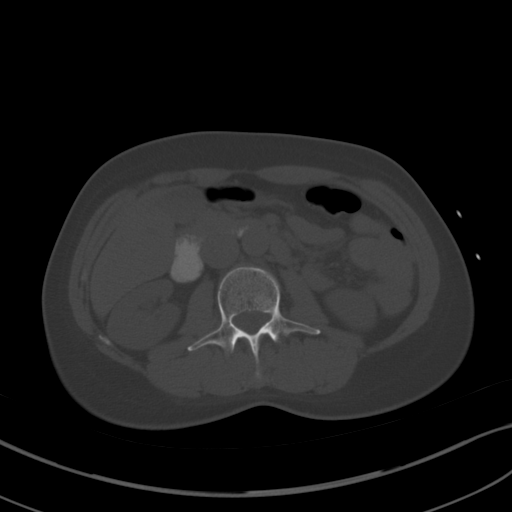
[im 64/88  soft-tissue]
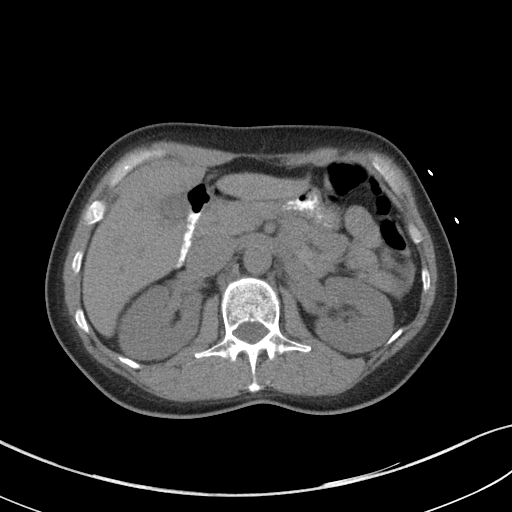
[im 71/88  soft-tissue]
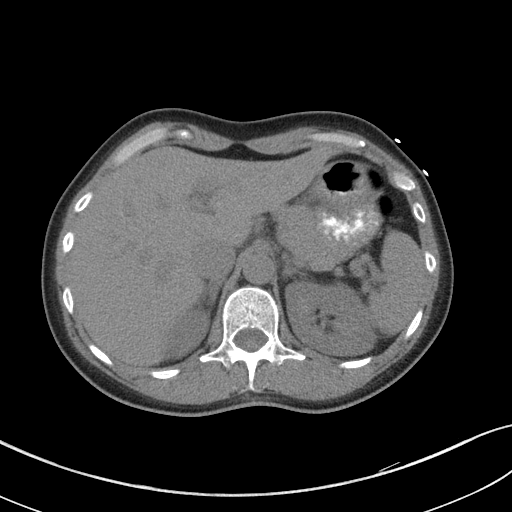
[im 77/88  soft-tissue]
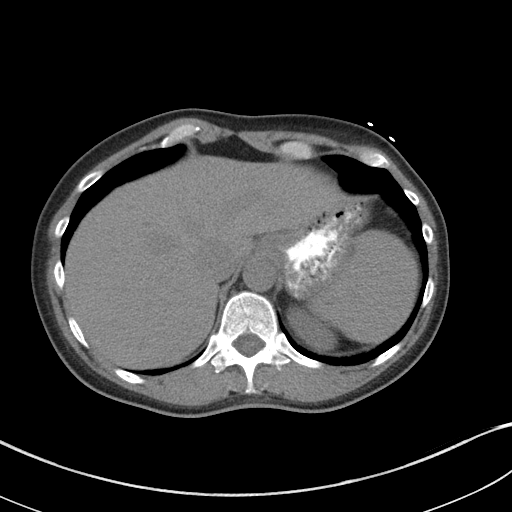
[im 84/88  soft-tissue]
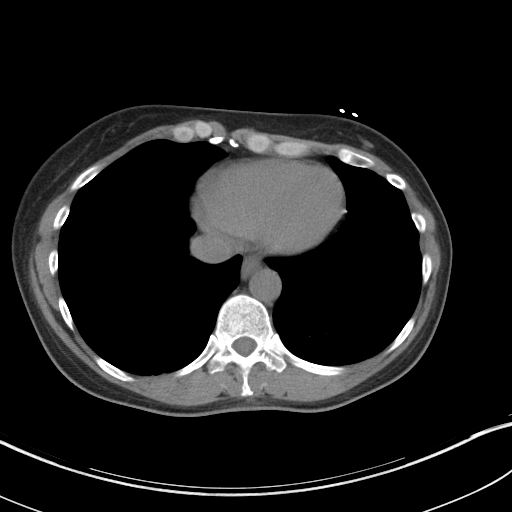

[Series 5: renal stone 3.0 coronal · coronal · 0.68mm/px · 3 of 61 slices shown]
[im 21/61  soft-tissue]
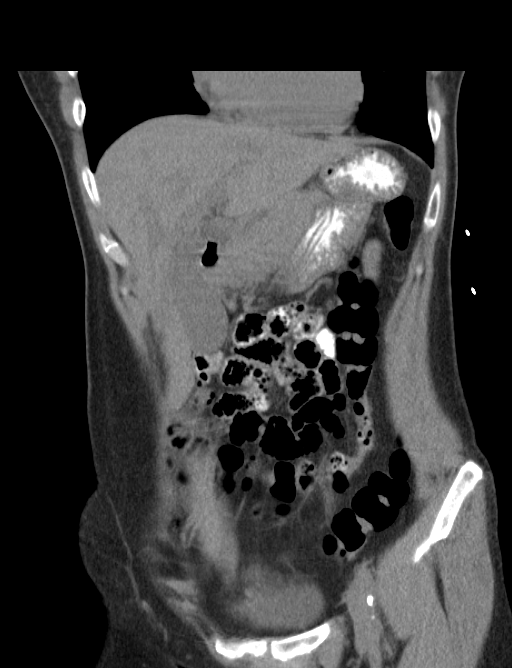
[im 27/61  soft-tissue]
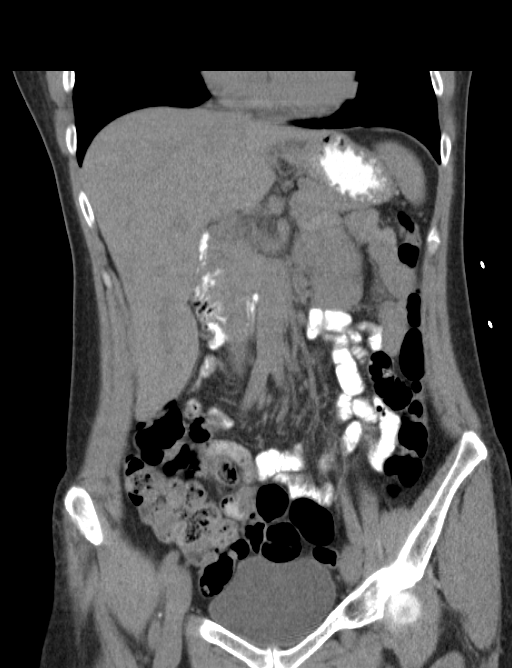
[im 34/61  soft-tissue]
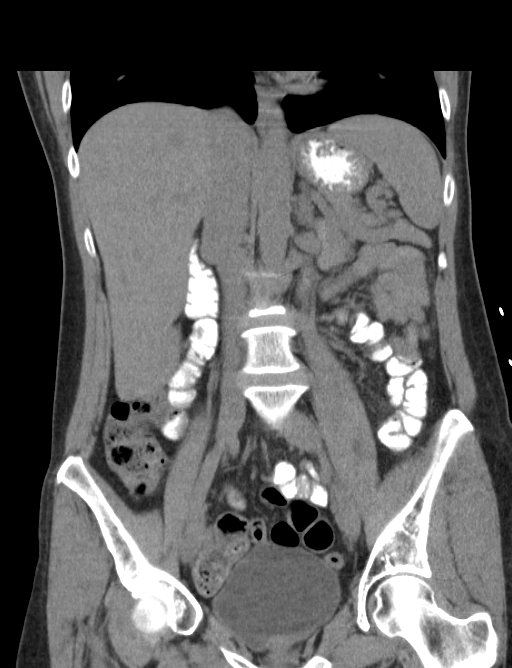

[16 of 46 positions shown; findings below may reference images not displayed]

FINDINGS: Several small right renal calculi.  The largest is in the
midportion, measuring 4 mm in maximum diameter.  Tiny upper pole
left renal calculus.  No bladder or ureteral calculi and no
hydronephrosis.  The previously demonstrated calcification to the
right of the L3-4 disc space is shown to represent an annulus
calcification in a bulging disc.

Bilateral pelvic phleboliths are noted.  Unremarkable noncontrasted
appearance of the liver, spleen, pancreas, gallbladder and adrenal
glands.  Surgically absent uterus.  No gastrointestinal
abnormalities or enlarged lymph nodes seen.  Clear lung bases.
Mild lumbar spine degenerative changes.
IMPRESSION: 1.  No acute abnormality.
2.  Small, nonobstructing right renal calculi and tiny,
nonobstructing left renal calculus.
3.  No ureteral calculi or hydronephrosis.

## 2012-05-25 IMAGING — CR DG ABDOMEN ACUTE W/ 1V CHEST
3 series · 3 of 3 positions shown · non-contrast
Comparison: None.

CLINICAL DATA: Pain.  Nausea and vomiting.  Abdominal cramping.

ACUTE ABDOMEN SERIES (ABDOMEN 2 VIEW & CHEST 1 VIEW)

[w chest pa]
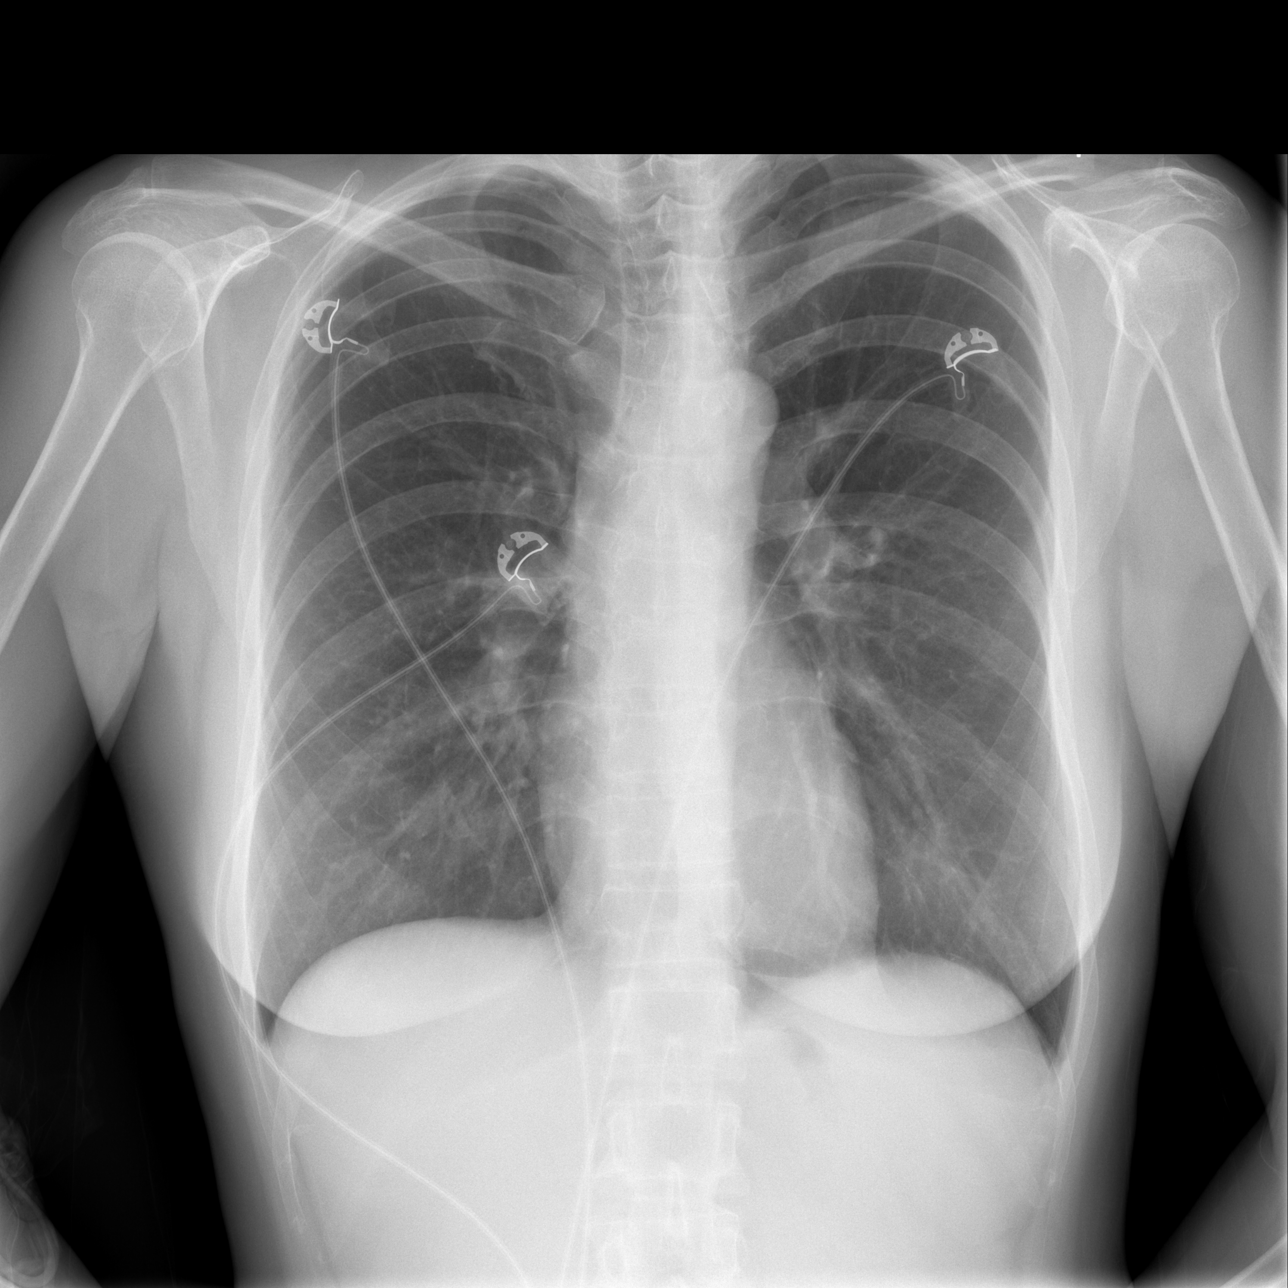

[w abdomen upright]
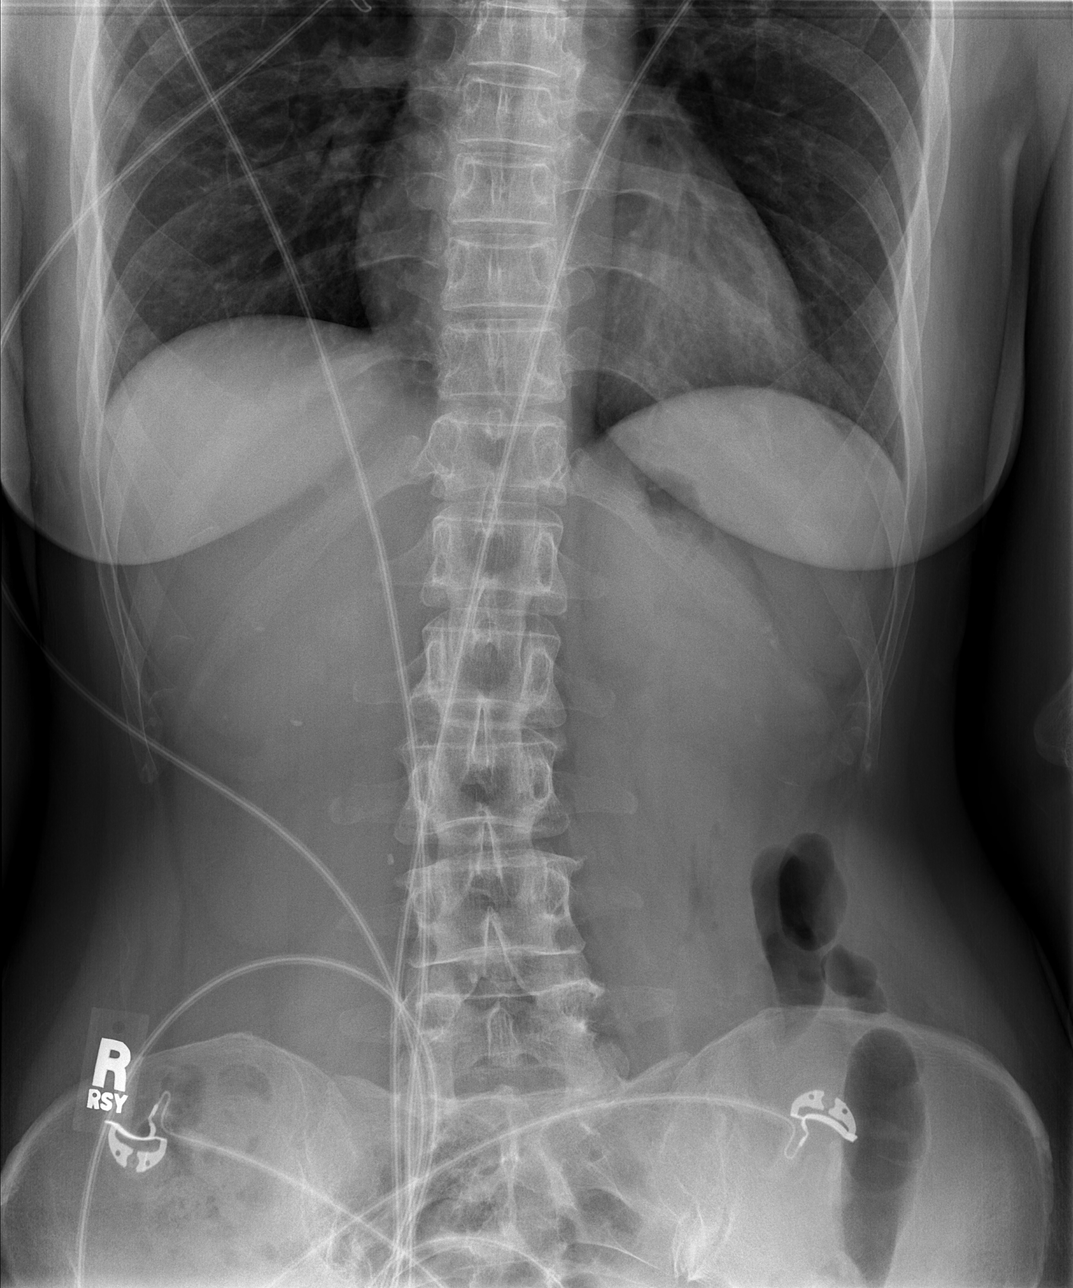

[t abdomen supine]
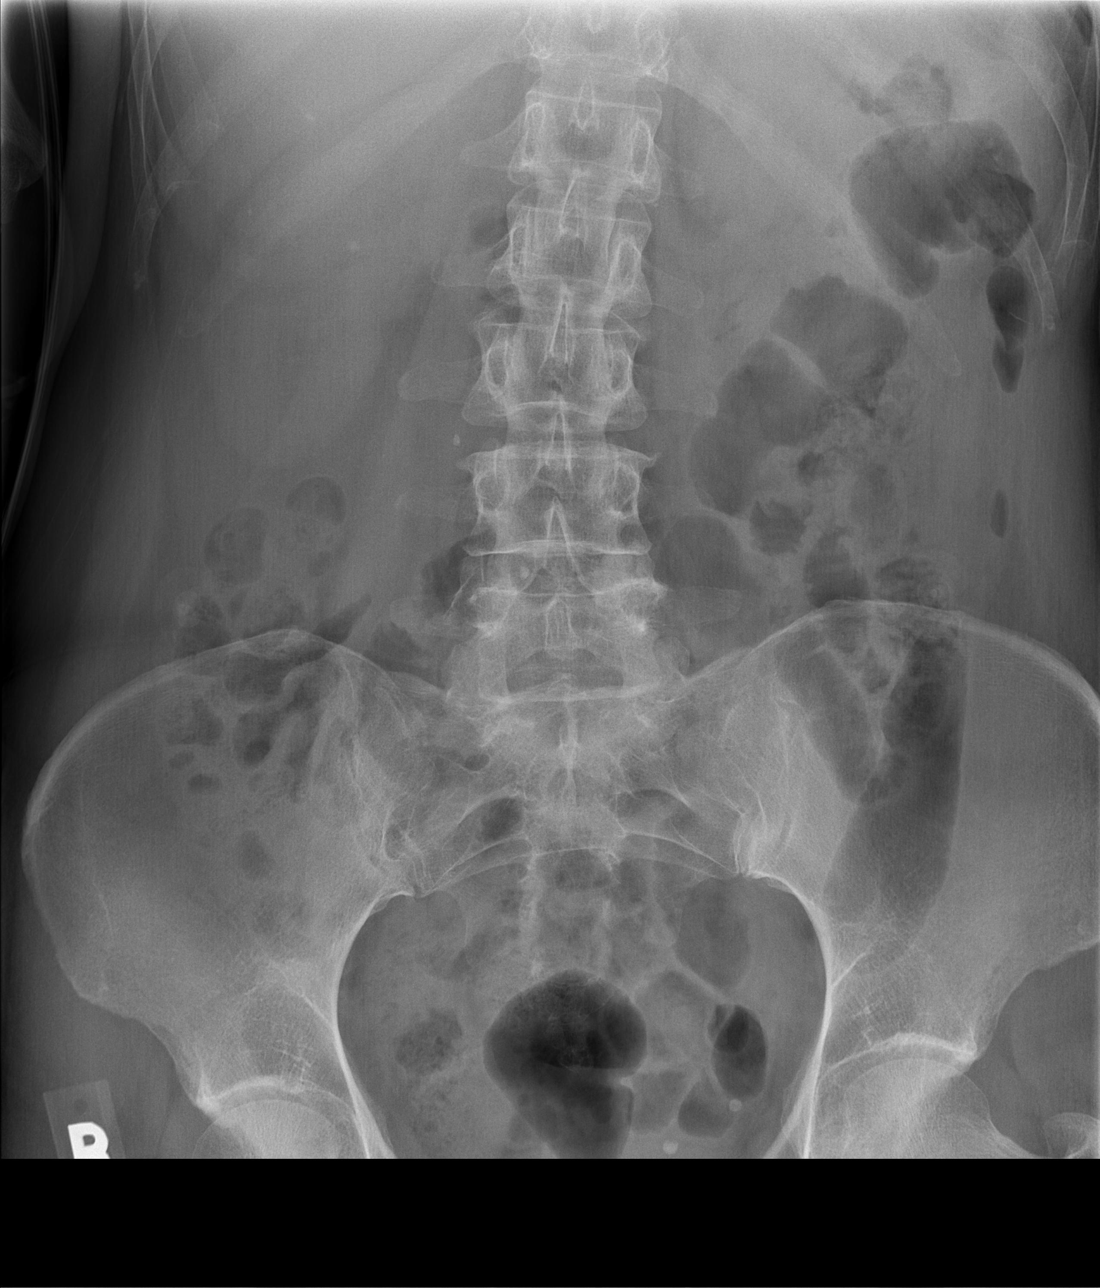

[3 of 3 positions shown; findings below may reference images not displayed]

FINDINGS: There is vague nodularity at the right lung base on the
upright view of the abdomen but not on the dedicated chest
radiographic view.  I suspect that this finding is artifactual.
The lungs appear otherwise clear.  Cardiac and mediastinal contours
appear unremarkable.

A 4 mm density projects over the right mid kidney and could
represent a renal calculus.

A 4 mm calcific density projects between the right transverse
processes of L3 and L4, and is probably just medial to the ureter,
but could conceivably represent a ureteral calcification.  However
this could also be a vascular calcification.

Several pelvic phleboliths are present.

No free intraperitoneal gas noted.

No dilated bowel noted.  No significant abnormal air-fluid levels.
IMPRESSION: 1.  Unremarkable bowel gas pattern.
2.  A calcific density on the right could be a nonobstructive renal
calculus.
3.  A second right calcific density could conceivably be in the
right ureter, but is probably to medial to the ureter and thus is
more likely vascular calcification.
4.  Vague nodularity at the right lung base is only suggested on a
single view and is likely artifactual.

## 2013-03-23 ENCOUNTER — Other Ambulatory Visit: Payer: Self-pay

## 2013-03-23 DIAGNOSIS — Z1231 Encounter for screening mammogram for malignant neoplasm of breast: Secondary | ICD-10-CM

## 2013-04-30 ENCOUNTER — Ambulatory Visit
Admission: RE | Admit: 2013-04-30 | Discharge: 2013-04-30 | Disposition: A | Discharge status: Home / Self Care | Payer: BC Managed Care – PPO | Source: Ambulatory Visit

## 2013-04-30 DIAGNOSIS — Z1231 Encounter for screening mammogram for malignant neoplasm of breast: Secondary | ICD-10-CM

## 2014-06-04 ENCOUNTER — Encounter: Payer: Self-pay | Admitting: Physician Assistant

## 2014-06-04 ENCOUNTER — Ambulatory Visit (INDEPENDENT_AMBULATORY_CARE_PROVIDER_SITE_OTHER): Payer: BLUE CROSS/BLUE SHIELD | Admitting: Physician Assistant

## 2014-06-04 VITALS — BP 123/74 | HR 63 | Temp 98.0°F | Resp 16 | Ht 66.5 in | Wt 148.2 lb

## 2014-06-04 DIAGNOSIS — H6981 Other specified disorders of Eustachian tube, right ear: Secondary | ICD-10-CM

## 2014-06-04 DIAGNOSIS — H9201 Otalgia, right ear: Secondary | ICD-10-CM

## 2014-06-04 MED ORDER — FLUTICASONE PROPIONATE 50 MCG/ACT NA SUSP: 2.0000 | Freq: Every day | NASAL | Status: DC

## 2014-06-04 NOTE — Progress Notes (Signed)
Pre visit review using our clinic review tool, if applicable. No additional management support is needed unless otherwise documented below in the visit note/SLS  

## 2014-06-04 NOTE — Assessment & Plan Note (Signed)
Suspect multifactorial due to pressure from ETD and some long-standing TMJ dysfunction.  Rx Flonase.  Topical Aspercreme to R jaw and ear. Supportive measures discussed.

## 2014-06-04 NOTE — Progress Notes (Signed)
Patient presents to clinic today c/o intermittent R ear pain and discomfort noticed since fall of last year.  Denies known history of seasonal allergies. Denies trauma or injury.  Denies fever, URI or drainage from ear.    Past Medical History  Diagnosis Date  . Arthritis   . Genital warts   . IBS (irritable bowel syndrome)   . Right ear pain   . Kidney stones     Current Outpatient Prescriptions on File Prior to Visit  Medication Sig Dispense Refill  . acetaminophen (TYLENOL) 325 MG tablet Take 650 mg by mouth as needed.    . Cholecalciferol (VITAMIN D-3) 1000 UNITS CAPS Take by mouth daily.    . fish oil-omega-3 fatty acids 1000 MG capsule Take by mouth daily.    Marland Kitchen ibuprofen (ADVIL,MOTRIN) 200 MG tablet Take 200 mg by mouth as needed.    . Multiple Vitamin (MULTIVITAMIN) tablet Take 1 tablet by mouth daily.     No current facility-administered medications on file prior to visit.    No Known Allergies  Family History  Problem Relation Age of Onset  . Hypertension Mother     Living  . Arthritis Father   . Hyperlipidemia Father 54    Deceased  . Heart disease Father   . Hyperlipidemia Sister   . Hypertension Sister   . Stroke Maternal Grandmother   . Alcohol abuse Maternal Grandfather   . Stroke Paternal Grandmother   . Heart disease Paternal Grandfather   . Hypertension Sister     x1  . Healthy Sister     x4  . Arthritis Sister     x1    History   Social History  . Marital Status: Married    Spouse Name: N/A  . Number of Children: N/A  . Years of Education: N/A   Social History Main Topics  . Smoking status: Never Smoker   . Smokeless tobacco: Not on file  . Alcohol Use: Yes  . Drug Use: No  . Sexual Activity: Not on file   Other Topics Concern  . None   Social History Narrative   Review of Systems - See HPI.  All other ROS are negative.  BP 123/74 mmHg  Pulse 63  Temp(Src) 98 F (36.7 C) (Oral)  Resp 16  Ht 5' 6.5" (1.689 m)  Wt 148 lb  4 oz (67.246 kg)  BMI 23.57 kg/m2  SpO2 98%  Physical Exam  Constitutional: She is oriented to person, place, and time and well-developed, well-nourished, and in no distress.  HENT:  Head: Normocephalic and atraumatic.  Right Ear: External ear and ear canal normal. No swelling or tenderness. Tympanic membrane is retracted. Tympanic membrane is not erythematous and not bulging.  Left Ear: Tympanic membrane, external ear and ear canal normal.  Ears:  Nose: Nose normal.  Mouth/Throat: Uvula is midline, oropharynx is clear and moist and mucous membranes are normal.  Eyes: Conjunctivae are normal.  Neck: Neck supple.  Cardiovascular: Normal rate, regular rhythm, normal heart sounds and intact distal pulses.   Pulmonary/Chest: Effort normal and breath sounds normal. No respiratory distress. She has no wheezes. She has no rales. She exhibits no tenderness.  Neurological: She is alert and oriented to person, place, and time.  Skin: Skin is warm and dry. No rash noted.  Psychiatric: Affect normal.  Vitals reviewed.  Assessment/Plan: Eustachian tube dysfunction Rx Flonase daily. Encouraged Claritin-D over the next week.   Otalgia Suspect multifactorial due to pressure from  ETD and some long-standing TMJ dysfunction.  Rx Flonase.  Topical Aspercreme to R jaw and ear. Supportive measures discussed.

## 2014-06-04 NOTE — Patient Instructions (Signed)
Please begin taking Flonase daily as directed.  Add on a Claritin-D when symptoms are flared up.  Also continue mouthguard use because I believe there is some TMJ dysfunction that is contributing to her symptoms.  Follow-up if no improvement.  Barotitis Media Barotitis media is inflammation of your middle ear. This occurs when the auditory tube (eustachian tube) leading from the back of your nose (nasopharynx) to your eardrum is blocked. This blockage may result from a cold, environmental allergies, or an upper respiratory infection. Unresolved barotitis media may lead to damage or hearing loss (barotrauma), which may become permanent. HOME CARE INSTRUCTIONS   Use medicines as recommended by your health care provider. Over-the-counter medicines will help unblock the canal and can help during times of air travel.  Do not put anything into your ears to clean or unplug them. Eardrops will not be helpful.  Do not swim, dive, or fly until your health care provider says it is all right to do so. If these activities are necessary, chewing gum with frequent, forceful swallowing may help. It is also helpful to hold your nose and gently blow to pop your ears for equalizing pressure changes. This forces air into the eustachian tube.  Only take over-the-counter or prescription medicines for pain, discomfort, or fever as directed by your health care provider.  A decongestant may be helpful in decongesting the middle ear and make pressure equalization easier. SEEK MEDICAL CARE IF:  You experience a serious form of dizziness in which you feel as if the room is spinning and you feel nauseated (vertigo).  Your symptoms only involve one ear. SEEK IMMEDIATE MEDICAL CARE IF:   You develop a severe headache, dizziness, or severe ear pain.  You have bloody or pus-like drainage from your ears.  You develop a fever.  Your problems do not improve or become worse. MAKE SURE YOU:   Understand these  instructions.  Will watch your condition.  Will get help right away if you are not doing well or get worse. Document Released: 04/02/2000 Document Revised: 01/24/2013 Document Reviewed: 10/31/2012 [facility] Patient Information 2015 [place], [place]. This information is not intended to replace advice given to you by your health care provider. Make sure you discuss any questions you have with your health care provider.

## 2014-06-04 NOTE — Assessment & Plan Note (Signed)
Rx Flonase daily. Encouraged Claritin-D over the next week.

## 2014-06-17 ENCOUNTER — Telehealth: Payer: Self-pay | Admitting: Internal Medicine

## 2014-06-17 NOTE — Telephone Encounter (Signed)
THE NASAL SPRAY IS NOT WORKING TO TAKE THE PAIN IN HER EAR AWAY.  PLEASE ADVISE WHAT TO DO NOW.  SHE IS FLYING ON Friday

## 2014-06-17 NOTE — Telephone Encounter (Signed)
Patient needs appointment for reevaluation as it has been 2 weeks since being seen in office; please schedule pt appointment today with available provider, and she needs to Establish Care with a provider, as she now does not have a PCP/SLS

## 2014-06-19 ENCOUNTER — Ambulatory Visit: Payer: BLUE CROSS/BLUE SHIELD | Admitting: Medical

## 2014-06-20 ENCOUNTER — Ambulatory Visit (INDEPENDENT_AMBULATORY_CARE_PROVIDER_SITE_OTHER): Payer: BLUE CROSS/BLUE SHIELD | Admitting: Family Medicine

## 2014-06-20 ENCOUNTER — Encounter: Payer: Self-pay | Admitting: Family Medicine

## 2014-06-20 VITALS — BP 100/62 | HR 66 | Temp 97.8°F | Ht 66.5 in | Wt 148.7 lb

## 2014-06-20 DIAGNOSIS — H9201 Otalgia, right ear: Secondary | ICD-10-CM

## 2014-06-20 DIAGNOSIS — S0300XS Dislocation of jaw, unspecified side, sequela: Secondary | ICD-10-CM

## 2014-06-20 DIAGNOSIS — J309 Allergic rhinitis, unspecified: Secondary | ICD-10-CM

## 2014-06-20 DIAGNOSIS — S030XXS Dislocation of jaw, sequela: Secondary | ICD-10-CM

## 2014-06-20 NOTE — Progress Notes (Signed)
HPI:  Eustachian Tube Dysfunction: -pain on and off in R ear and jaw for years -wears mouth guard at night for clenching from her dentist -popping in ears sometimes -sat [facility] a few weeks ago and told was fluid in ear -denies: sneezing, watery or itchy eyes, PND, nasal congestion -reports saw and ENT for this several years ago -has tried flonase for 2 weeks and has not helped much -changing to me for PCP -denies: hearing loss, locking of jaw, fevers, malaise, drainage from ears  ROS: See pertinent positives and negatives per HPI.  Past Medical History  Diagnosis Date  . Arthritis   . Genital warts   . IBS (irritable bowel syndrome)   . Right ear pain   . Kidney stones     Past Surgical History  Procedure Laterality Date  . Abdominal hysterectomy    . Appendectomy    . Basal cell carcinoma excision    . Wisdom tooth extraction      Family History  Problem Relation Age of Onset  . Hypertension Mother     Living  . Arthritis Father   . Hyperlipidemia Father 76    Deceased  . Heart disease Father   . Hyperlipidemia Sister   . Hypertension Sister   . Stroke Maternal Grandmother   . Alcohol abuse Maternal Grandfather   . Stroke Paternal Grandmother   . Heart disease Paternal Grandfather   . Hypertension Sister     x1  . Healthy Sister     x4  . Arthritis Sister     x1    History   Social History  . Marital Status: Married    Spouse Name: N/A  . Number of Children: N/A  . Years of Education: N/A   Social History Main Topics  . Smoking status: Never Smoker   . Smokeless tobacco: Not on file  . Alcohol Use: Yes  . Drug Use: No  . Sexual Activity: Not on file   Other Topics Concern  . None   Social History Narrative     Current outpatient prescriptions:  .  Cholecalciferol (VITAMIN D-3) 1000 UNITS CAPS, Take by mouth daily., Disp: , Rfl:  .  clobetasol cream (TEMOVATE) 0.05 %, , Disp: , Rfl: 1 .  ESTRACE VAGINAL 0.1 MG/GM vaginal cream, , Disp: ,  Rfl: 2 .  estradiol (ESTRACE) 0.5 MG tablet, Take 0.5 mg by mouth daily., Disp: , Rfl: 11 .  fluticasone (FLONASE) 50 MCG/ACT nasal spray, Place 2 sprays into both nostrils daily., Disp: 16 g, Rfl: 2 .  loratadine (CLARITIN) 10 MG tablet, Take 10 mg by mouth daily., Disp: , Rfl:  .  Multiple Vitamin (MULTIVITAMIN) tablet, Take 1 tablet by mouth daily., Disp: , Rfl:   EXAM:  Filed Vitals:   06/20/14 0806  BP: 100/62  Pulse: 66  Temp: 97.8 F (36.6 C)    Body mass index is 23.64 kg/(m^2).  GENERAL: vitals reviewed and listed above, alert, oriented, appears well hydrated and in no acute distress  HEENT: atraumatic, conjunttiva clear, no obvious abnormalities on inspection of external nose and ears, clear mild effusion L ear, no effusion, bulging or abnormalities seen of inspection of R ear, crepitus TMJ joints bilat, no catching or deviation of the jaw  NECK: no obvious masses on inspection  LUNGS: clear to auscultation bilaterally, no wheezes, rales or rhonchi, good air movement  CV: HRRR, no peripheral edema  MS: moves all extremities without noticeable abnormality  PSYCH: pleasant and cooperative, no  obvious depression or anxiety  ASSESSMENT AND PLAN:  Discussed the following assessment and plan:  Allergic rhinitis, unspecified allergic rhinitis type  Ear pain, right  TMJ (dislocation of temporomandibular joint), sequela  -tx allergies and TMJ - though favor TMJ dysfunction as cause of R ear pain per exam findings today -exercises provided, tylenol prn for pain in safe doses discussed -Patient advised to return or notify a doctor immediately if symptoms worsen or persist or new concerns arise.  Patient Instructions  Do the exercises provided daily  Claritin and flonase daily for next 1-2 months  Follow up in may as scheduled and if jaw issues persist may have you see TMJ specialist for further eval  Tylenol 500-1000mg  up to 3 times per day (max 3000mg  per day)  only as needed for pain     Jordan Record R.

## 2014-06-20 NOTE — Progress Notes (Signed)
Pre visit review using our clinic review tool, if applicable. No additional management support is needed unless otherwise documented below in the visit note. 

## 2014-06-20 NOTE — Patient Instructions (Signed)
Do the exercises provided daily  Claritin and flonase daily for next 1-2 months  Follow up in may as scheduled and if jaw issues persist may have you see TMJ specialist for further eval  Tylenol 500-1000mg  up to 3 times per day (max 3000mg  per day) only as needed for pain

## 2014-09-02 ENCOUNTER — Ambulatory Visit (INDEPENDENT_AMBULATORY_CARE_PROVIDER_SITE_OTHER): Payer: BLUE CROSS/BLUE SHIELD | Admitting: Family Medicine

## 2014-09-02 ENCOUNTER — Encounter: Payer: Self-pay | Admitting: Family Medicine

## 2014-09-02 VITALS — BP 100/62 | HR 63 | Temp 97.8°F | Ht 66.0 in | Wt 148.8 lb

## 2014-09-02 DIAGNOSIS — H9201 Otalgia, right ear: Secondary | ICD-10-CM

## 2014-09-02 DIAGNOSIS — E785 Hyperlipidemia, unspecified: Secondary | ICD-10-CM | POA: Diagnosis not present

## 2014-09-02 DIAGNOSIS — N951 Menopausal and female climacteric states: Secondary | ICD-10-CM | POA: Diagnosis not present

## 2014-09-02 DIAGNOSIS — Z7189 Other specified counseling: Secondary | ICD-10-CM

## 2014-09-02 DIAGNOSIS — Z131 Encounter for screening for diabetes mellitus: Secondary | ICD-10-CM

## 2014-09-02 DIAGNOSIS — Z7689 Persons encountering health services in other specified circumstances: Secondary | ICD-10-CM

## 2014-09-02 DIAGNOSIS — R232 Flushing: Secondary | ICD-10-CM

## 2014-09-02 NOTE — Progress Notes (Signed)
Pre visit review using our clinic review tool, if applicable. No additional management support is needed unless otherwise documented below in the visit note. 

## 2014-09-02 NOTE — Progress Notes (Signed)
HPI:  Jordan Khan is here to establish care. Used to see Dr. Elizebeth Koller.She feels well other then so ear issues. She reports a history of mild hyperlipidemia not requiring any treatment. Last PCP and physical: sees Dr. Paula Compton   Has the following chronic problems that require follow up and concerns today:  R ear pain: -possible TMJ, allergies too -got better after last visit, then recurred mildly recently - mild intermittent pain in R  Ear andTMJ region the last 1 week -reports lost exercises for TMJ and wants this  Hot flashes/Vaginal atrophy: -meds:vaginal estrace and estradiol for this by her gyn  ROS negative for unless reported above: fevers, unintentional weight loss, hearing or vision loss, chest pain, palpitations, struggling to breath, hemoptysis, melena, hematochezia, hematuria, falls, loc, si, thoughts of self harm  Past Medical History  Diagnosis Date  . Arthritis   . Genital warts   . IBS (irritable bowel syndrome)   . Right ear pain   . Kidney stones   . Rotator cuff syndrome of left shoulder 08/13/2010  . Basal cell carcinoma 08/13/2010    sees dermatologist  . Hot flashes     and vaginal atrophy - sees Dr. Marvel Plan    Past Surgical History  Procedure Laterality Date  . Abdominal hysterectomy    . Appendectomy    . Basal cell carcinoma excision    . Wisdom tooth extraction      Family History  Problem Relation Age of Onset  . Hypertension Mother     Living  . Arthritis Father   . Hyperlipidemia Father 50    Deceased  . Heart disease Father 65    MI  . Hyperlipidemia Sister   . Hypertension Sister   . Stroke Maternal Grandmother   . Alcohol abuse Maternal Grandfather   . Stroke Paternal Grandmother   . Heart disease Paternal Grandfather   . Hypertension Sister     x1  . Healthy Sister     x4  . Arthritis Sister     x1    History   Social History  . Marital Status: Married    Spouse Name: N/A  . Number of Children: N/A   . Years of Education: N/A   Social History Main Topics  . Smoking status: Never Smoker   . Smokeless tobacco: Not on file  . Alcohol Use: Yes     Comment: 1-2 glasses of wine once per week  . Drug Use: No  . Sexual Activity: Not on file   Other Topics Concern  . 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  Cholecalciferol (VITAMIN D-3) 1000 UNITS CAPS, Take by mouth daily., Disp: , Rfl:  .  ESTRACE VAGINAL 0.1 MG/GM vaginal cream, , Disp: , Rfl: 2 .  estradiol (ESTRACE) 0.5 MG tablet, Take 0.5 mg by mouth daily., Disp: , Rfl: 11 .  Multiple Vitamin (MULTIVITAMIN) tablet, Take 1 tablet by mouth daily., Disp: , Rfl:   EXAM:  Filed Vitals:   09/02/14 1437  BP: 100/62  Pulse: 63  Temp: 97.8 F (36.6 C)    Body mass index is 24.03 kg/(m^2).  GENERAL: vitals  reviewed and listed above, alert, oriented, appears well hydrated and in no acute distress  HEENT: atraumatic, conjunttiva clear, no obvious abnormalities on inspection of external nose and ears, normal appearance of ear canals and TMs - clear effusion L, clear nasal congestion, mild post oropharyngeal erythema with PND, no tonsillar edema or exudate, no sinus TTP, some clicking of R TMJ with opening of jaw -  no locking  NECK: no obvious masses on inspection  LUNGS: clear to auscultation bilaterally, no wheezes, rales or rhonchi, good air movement  CV: HRRR, no peripheral edema  MS: moves all extremities without noticeable abnormality  PSYCH: pleasant and cooperative, no obvious depression or anxiety  ASSESSMENT AND PLAN:  Discussed the following assessment and plan:  Ear pain, right -HEP provided again along with ENT number if symptoms persist  Hot flashes -cont current  tx  Hyperlipemia -check fasting labs  Encounter to establish care -We reviewed the PMH, PSH, FH, SH, Meds and Allergies. -We provided refills for any medications we will prescribe as needed. -We addressed current concerns per orders and patient instructions. -We have asked for records for pertinent exams, studies, vaccines and notes from previous providers. -We have advised patient to follow up per instructions below.   -Patient advised to return or notify a doctor immediately if symptoms worsen or persist or new concerns arise.  Patient Instructions  BEFORE YOU LEAVE: -TMJ exercises -lab only appointment  -We have ordered labs or studies at this visit. It can take up to 1-2 weeks for results and processing. We will contact you with instructions IF your results are abnormal. Normal results will be released to your [facility]. If you have not heard from [place] or can not find your results in [facility] in 2 weeks please contact our office.  -do the exercises for the jaw, if symptoms worsening or persist please see the ear, nose and throat doctor       Colin Benton R.

## 2014-09-02 NOTE — Patient Instructions (Addendum)
BEFORE YOU LEAVE: -TMJ exercises -lab only appointment  -We have ordered labs or studies at this visit. It can take up to 1-2 weeks for results and processing. We will contact you with instructions IF your results are abnormal. Normal results will be released to your [facility]. If you have not heard from [place] or can not find your results in [facility] in 2 weeks please contact our office.  -do the exercises for the jaw, if symptoms worsening or persist please see the ear, nose and throat doctor

## 2014-09-19 ENCOUNTER — Other Ambulatory Visit (INDEPENDENT_AMBULATORY_CARE_PROVIDER_SITE_OTHER): Payer: BLUE CROSS/BLUE SHIELD

## 2014-09-19 DIAGNOSIS — E785 Hyperlipidemia, unspecified: Secondary | ICD-10-CM

## 2014-09-19 DIAGNOSIS — Z131 Encounter for screening for diabetes mellitus: Secondary | ICD-10-CM

## 2014-09-19 LAB — LIPID PANEL
Cholesterol: 236 mg/dL — ABNORMAL HIGH (ref 0–200)
HDL: 78.3 mg/dL (ref >39.00)
LDL Cholesterol: 129 mg/dL — ABNORMAL HIGH (ref 0–99)
NonHDL: 157.7
Total CHOL/HDL Ratio: 3
Triglycerides: 146 mg/dL (ref 0.0–149.0)
VLDL: 29.2 mg/dL (ref 0.0–40.0)

## 2014-09-19 LAB — HEMOGLOBIN A1C: Hgb A1c MFr Bld: 5.5 % (ref 4.6–6.5)

## 2015-05-20 LAB — HM MAMMOGRAPHY

## 2015-09-01 DIAGNOSIS — H524 Presbyopia: Secondary | ICD-10-CM | POA: Diagnosis not present

## 2015-09-08 DIAGNOSIS — B300 Keratoconjunctivitis due to adenovirus: Secondary | ICD-10-CM | POA: Diagnosis not present

## 2015-09-16 DIAGNOSIS — L814 Other melanin hyperpigmentation: Secondary | ICD-10-CM | POA: Diagnosis not present

## 2015-09-16 DIAGNOSIS — B300 Keratoconjunctivitis due to adenovirus: Secondary | ICD-10-CM | POA: Diagnosis not present

## 2015-09-16 DIAGNOSIS — L570 Actinic keratosis: Secondary | ICD-10-CM | POA: Diagnosis not present

## 2015-09-22 DIAGNOSIS — B300 Keratoconjunctivitis due to adenovirus: Secondary | ICD-10-CM | POA: Diagnosis not present

## 2015-10-08 DIAGNOSIS — Z808 Family history of malignant neoplasm of other organs or systems: Secondary | ICD-10-CM | POA: Diagnosis not present

## 2015-10-08 DIAGNOSIS — L814 Other melanin hyperpigmentation: Secondary | ICD-10-CM | POA: Diagnosis not present

## 2015-10-08 DIAGNOSIS — L821 Other seborrheic keratosis: Secondary | ICD-10-CM | POA: Diagnosis not present

## 2015-10-08 DIAGNOSIS — D2272 Melanocytic nevi of left lower limb, including hip: Secondary | ICD-10-CM | POA: Diagnosis not present

## 2015-12-11 DIAGNOSIS — B300 Keratoconjunctivitis due to adenovirus: Secondary | ICD-10-CM | POA: Diagnosis not present

## 2016-01-21 DIAGNOSIS — M9902 Segmental and somatic dysfunction of thoracic region: Secondary | ICD-10-CM | POA: Diagnosis not present

## 2016-01-21 DIAGNOSIS — M9903 Segmental and somatic dysfunction of lumbar region: Secondary | ICD-10-CM | POA: Diagnosis not present

## 2016-01-21 DIAGNOSIS — M9904 Segmental and somatic dysfunction of sacral region: Secondary | ICD-10-CM | POA: Diagnosis not present

## 2016-01-21 DIAGNOSIS — M9905 Segmental and somatic dysfunction of pelvic region: Secondary | ICD-10-CM | POA: Diagnosis not present

## 2016-01-26 DIAGNOSIS — M9904 Segmental and somatic dysfunction of sacral region: Secondary | ICD-10-CM | POA: Diagnosis not present

## 2016-01-26 DIAGNOSIS — M9903 Segmental and somatic dysfunction of lumbar region: Secondary | ICD-10-CM | POA: Diagnosis not present

## 2016-01-26 DIAGNOSIS — M9902 Segmental and somatic dysfunction of thoracic region: Secondary | ICD-10-CM | POA: Diagnosis not present

## 2016-01-26 DIAGNOSIS — M9905 Segmental and somatic dysfunction of pelvic region: Secondary | ICD-10-CM | POA: Diagnosis not present

## 2016-02-02 DIAGNOSIS — M9905 Segmental and somatic dysfunction of pelvic region: Secondary | ICD-10-CM | POA: Diagnosis not present

## 2016-02-02 DIAGNOSIS — M9904 Segmental and somatic dysfunction of sacral region: Secondary | ICD-10-CM | POA: Diagnosis not present

## 2016-02-02 DIAGNOSIS — M9903 Segmental and somatic dysfunction of lumbar region: Secondary | ICD-10-CM | POA: Diagnosis not present

## 2016-02-02 DIAGNOSIS — M9902 Segmental and somatic dysfunction of thoracic region: Secondary | ICD-10-CM | POA: Diagnosis not present

## 2016-02-11 DIAGNOSIS — M9904 Segmental and somatic dysfunction of sacral region: Secondary | ICD-10-CM | POA: Diagnosis not present

## 2016-02-11 DIAGNOSIS — M9905 Segmental and somatic dysfunction of pelvic region: Secondary | ICD-10-CM | POA: Diagnosis not present

## 2016-02-11 DIAGNOSIS — M9902 Segmental and somatic dysfunction of thoracic region: Secondary | ICD-10-CM | POA: Diagnosis not present

## 2016-02-11 DIAGNOSIS — M9903 Segmental and somatic dysfunction of lumbar region: Secondary | ICD-10-CM | POA: Diagnosis not present

## 2016-03-01 DIAGNOSIS — Z130 Encounter for screening for diseases of the blood and blood-forming organs and certain disorders involving the immune mechanism: Secondary | ICD-10-CM | POA: Diagnosis not present

## 2016-03-01 DIAGNOSIS — Z1389 Encounter for screening for other disorder: Secondary | ICD-10-CM | POA: Diagnosis not present

## 2016-03-01 DIAGNOSIS — Z6823 Body mass index (BMI) 23.0-23.9, adult: Secondary | ICD-10-CM | POA: Diagnosis not present

## 2016-03-01 DIAGNOSIS — E8941 Symptomatic postprocedural ovarian failure: Secondary | ICD-10-CM | POA: Diagnosis not present

## 2016-03-01 DIAGNOSIS — Z01419 Encounter for gynecological examination (general) (routine) without abnormal findings: Secondary | ICD-10-CM | POA: Diagnosis not present

## 2016-03-17 DIAGNOSIS — M25571 Pain in right ankle and joints of right foot: Secondary | ICD-10-CM | POA: Diagnosis not present

## 2016-04-19 HISTORY — PX: FOOT SURGERY: SHX648

## 2016-04-26 ENCOUNTER — Encounter: Payer: Self-pay | Admitting: Family Medicine

## 2016-05-03 DIAGNOSIS — Z23 Encounter for immunization: Secondary | ICD-10-CM | POA: Diagnosis not present

## 2016-05-13 ENCOUNTER — Ambulatory Visit (INDEPENDENT_AMBULATORY_CARE_PROVIDER_SITE_OTHER): Payer: BLUE CROSS/BLUE SHIELD | Admitting: Podiatry

## 2016-05-13 ENCOUNTER — Encounter: Payer: Self-pay | Admitting: Podiatry

## 2016-05-13 ENCOUNTER — Ambulatory Visit (INDEPENDENT_AMBULATORY_CARE_PROVIDER_SITE_OTHER): Payer: BLUE CROSS/BLUE SHIELD

## 2016-05-13 ENCOUNTER — Other Ambulatory Visit: Payer: Self-pay | Admitting: *Deleted

## 2016-05-13 DIAGNOSIS — M205X1 Other deformities of toe(s) (acquired), right foot: Secondary | ICD-10-CM

## 2016-05-13 DIAGNOSIS — M79674 Pain in right toe(s): Secondary | ICD-10-CM | POA: Diagnosis not present

## 2016-05-13 DIAGNOSIS — M779 Enthesopathy, unspecified: Secondary | ICD-10-CM | POA: Diagnosis not present

## 2016-05-13 NOTE — Progress Notes (Signed)
   Subjective:    Patient ID: Jordan Khan, female    DOB: [birth year], 56 y.o.   MRN: [id number]  HPI: She presents today with a chief complaint of pain to the first metatarsophalangeal joint of the right foot. She states this been bothering her for some time and has progressed to the point where she saw an orthopedic surgeon who performed radiographs stating that there were no fractures. She states that the pain has become quite severe and she is concerned that there is arthritis present. She denies any trauma to the foot. States that the contralateral foot has no problems.    Review of Systems  Musculoskeletal: Positive for arthralgias.  All other systems reviewed and are negative.      Objective:   Physical Exam: Vital signs are stable alert and oriented 3. Pulses are palpable. Neurologic sensorium is intact. Deep tendon reflexes are intact. Muscle strength is 5 over 5 dorsiflexion plantar flexors and inverters everters all of his musculature is intact. Orthopedic evaluation and x-rays all joints distal to the ankle range of motion without crepitation. He has pain on dorsiflexion with some crepitation dorsally and she has pain on end range of motion. She also has a palpable spur to the dorsal aspect of the first metatarsal head. And limited range of motion. Radiographs taken today of the right foot do demonstrate slightly elongated elevated first metatarsal and joint space narrowing consistent with osteoarthritis dorsal spurs also consistent with osteoarthritis. I see no fracture of the spur. No fractures are noted on the radiographs regarding the remainder of the foot. Cutaneous evaluationof well-hydrated cutis no open lesions or wounds are noted.  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  [place]: We discussed in great detail today surgical correction consisting of an ostomy uncinate osteotomy with screw fixation right foot. We went over consent form  Today  number by number giving her a full-time does question she suffered a regarding this procedure. I answered his to the best of my ability in layman's terms and discussed the possible postop complications which may include but are not limited to postoperative pain bleeding swelling infection recurrence need for further surgery continued development os of osteoarthritic changes we also discussed the possible need for [facility] arthroplasty with single silicone implant which may result in rejection or need for further surgery as well. She understands this is amenable to it she signed R3 patient consent form.  I also injected her first metatarsophalangeal joint today after sterile Betadine skin prep with 2 mg of dexamethasone and local anesthetic. We dispensed a Cam Walker today and I will follow-up with her at the time of surgery.        Assessment & Plan:

## 2016-05-13 NOTE — Patient Instructions (Signed)
Pre-Operative Instructions  Congratulations, you have decided to take an important step to improving your quality of life.  You can be assured that the doctors of [facility] will be with you every step of the way.  1. Plan to be at the surgery center/hospital at least 1 (one) hour prior to your scheduled time unless otherwise directed by the surgical center/hospital staff.  You must have a responsible adult accompany you, remain during the surgery and drive you home.  Make sure you have directions to the surgical center/hospital and know how to get there on time. 2. For hospital based surgery you will need to obtain a history and physical form from your family physician within 1 month prior to the date of surgery- we will give you a form for you primary physician.  3. We make every effort to accommodate the date you request for surgery.  There are however, times where surgery dates or times have to be moved.  We will contact you as soon as possible if a change in schedule is required.   4. No Aspirin/Ibuprofen for one week before surgery.  If you are on aspirin, any non-steroidal anti-inflammatory medications (Mobic, Aleve, Ibuprofen) you should stop taking it 7 days prior to your surgery.  You make take Tylenol  For pain prior to surgery.  5. Medications- If you are taking daily heart and blood pressure medications, seizure, reflux, allergy, asthma, anxiety, pain or diabetes medications, make sure the surgery center/hospital is aware before the day of surgery so they may notify you which medications to take or avoid the day of surgery. 6. No food or drink after midnight the night before surgery unless directed otherwise by surgical center/hospital staff. 7. No alcoholic beverages 24 hours prior to surgery.  No smoking 24 hours prior to or 24 hours after surgery. 8. Wear loose pants or shorts- loose enough to fit over bandages, boots, and casts. 9. No slip on shoes, sneakers are best. 10. Bring  your boot with you to the surgery center/hospital.  Also bring crutches or a walker if your physician has prescribed it for you.  If you do not have this equipment, it will be provided for you after surgery. 11. If you have not been contracted by the surgery center/hospital by the day before your surgery, call to confirm the date and time of your surgery. 12. Leave-time from work may vary depending on the type of surgery you have.  Appropriate arrangements should be made prior to surgery with your employer. 13. Prescriptions will be provided immediately following surgery by your doctor.  Have these filled as soon as possible after surgery and take the medication as directed. 14. Remove nail polish on the operative foot. 15. Wash the night before surgery.  The night before surgery wash the foot and leg well with the antibacterial soap provided and water paying special attention to beneath the toenails and in between the toes.  Rinse thoroughly with water and dry well with a towel.  Perform this wash unless told not to do so by your physician.  Enclosed: 1 Ice pack (please put in freezer the night before surgery)   1 Hibiclens skin cleaner   Pre-op Instructions  If you have any questions regarding the instructions, do not hesitate to call our office.  [place]: [address] [place], [place] [address] [phone]  [place]: [address] [place], [place] [address] [phone]  [place]: [address]  [place], [place] [address] [phone]   Dr.   Norman Regal DPM, Dr. Matthew Wagoner DPM, Dr. M. Todd Hyatt DPM, Dr. Titorya Stover DPM 

## 2016-05-14 ENCOUNTER — Telehealth: Payer: Self-pay | Admitting: *Deleted

## 2016-05-14 NOTE — Telephone Encounter (Signed)
"  I'd like to schedule my surgery with Dr. Milinda Pointer.  I would like to do it in July 13."  The date of July 13 is available.  I will get it scheduled.  "Will I hear from you anytime from now to surgery?"  No, you will not hear from [place] but you will hear from someone from the surgical center a day or two before.  They will give you the arrival time and ask for history and physical information.  "If I want to ask questions about my cost, who do I speak to?"  You will ask for Jocelyn Lamer in insurance.  "Can I speak to her now?" She will have to calculate your estimate.  I can get her to call you back.   "I have another question to ask her as well."  Call was transferred to [facility].

## 2016-05-19 ENCOUNTER — Telehealth: Payer: Self-pay | Admitting: *Deleted

## 2016-05-19 NOTE — Telephone Encounter (Addendum)
Pt states she has questions about her surgery. 05/21/2016-Pt states received an injection to help postpone her surgery to July, but the injection did not help much and if she was able to get another injection would it be give her enough pain relief to get to the July surgery and when could she get the shot. Pt asked about recovery, she knew she would be in the boot and not able to weight bear for 45 minutes out of a hour for 2 weeks, but when would she be able to drive. I told pt she may be in the boot for 4 weeks, and not able to drive for another 4-6 weeks when she was in the surgical shoe. Pt asked if she would be able to walk in the sand 10-12 weeks postop. Pt states Dr. Milinda Pointer had stated that once he was performing the surgery if there was not enough cartilage he may have to perform an implant to the joint area, pt wanted to know if it would benefit her to get the implant rather than the [place] procedure. I told pt I would need to ask Dr. Milinda Pointer about the beach and the implant and call again. 05/24/2016-I spoke with pt and she asked if she could call me back. Pt called again, states her mom is in [place] and not doing well. I apologized for interrupting her time with her mom this morning. I informed pt of Dr. Stephenie Acres recommendations. Pt states understanding.

## 2016-05-20 ENCOUNTER — Telehealth: Payer: Self-pay | Admitting: *Deleted

## 2016-05-20 NOTE — Telephone Encounter (Signed)
"  I was just looking on MyChart and I didn't see my surgery appointment.  I am not sure if that is supposed to be on there or not.  I want to confirm whether or not it should or shouldn't be on there and to make sure I am scheduled.  Please give me a call back."  05/20/2016 I am returning your call.  You will not see the surgical center in [place].  They are not part of [facility], they are a separate entity.  You are schedule for July.  "Oh okay, I just wanted to make sure.  Also, I have another question.  The cortisone shot has not worked.  What's the earliest he can do my surgery if I decided to do it sooner?"  He can do it on March 9.  "Okay, thank you."

## 2016-05-21 DIAGNOSIS — Z0000 Encounter for general adult medical examination without abnormal findings: Secondary | ICD-10-CM | POA: Diagnosis not present

## 2016-05-21 DIAGNOSIS — Z1231 Encounter for screening mammogram for malignant neoplasm of breast: Secondary | ICD-10-CM | POA: Diagnosis not present

## 2016-05-24 NOTE — Telephone Encounter (Signed)
Cant have another shot until March.  Dont' know if that would do it.  The implant is only an option if she is missing more than 50% of cartilage.  Depends on her course post op and her compliance as to whether she can walk on a beach or not in 12 weeks.  Also she really has not allowed enough time for the previous injection to work.  It is not instant

## 2016-06-03 ENCOUNTER — Telehealth: Payer: Self-pay | Admitting: *Deleted

## 2016-06-03 NOTE — Telephone Encounter (Addendum)
"  I spoke to you this morning and we discussed some dates for surgery with Dr. Milinda Pointer.  I would like to change the date.  I want to take the April 13 slot.  I am scheduled for July and I think that was the 13 also.  If we could do April 13.  If you could call to confirm that that is all good to go.  Thank you."   I called and spoke to Caren Griffins at [facility] and rescheduled surgery from 10/29/2016 to 07/30/2016.   I left patient a message that surgery was rescheduled to 07/30/2016.

## 2016-06-03 NOTE — Telephone Encounter (Signed)
"  I'm currently scheduled for surgery in July with Dr. Milinda Pointer.  I'm considering moving it up.  I want to see what is still available in the last half of March, either of the last two Fridays.  If you would let me know that would be great.  Thank you."  I am returning your call.  Dr. Milinda Pointer is not available any of those dates.  He can do it March 16 or March 9.  "What about the first Friday in April?"  He is not available then either.  He can do it April 13.  "Okay, I will get back to you."

## 2016-06-09 DIAGNOSIS — D2272 Melanocytic nevi of left lower limb, including hip: Secondary | ICD-10-CM | POA: Diagnosis not present

## 2016-06-09 DIAGNOSIS — Z85828 Personal history of other malignant neoplasm of skin: Secondary | ICD-10-CM | POA: Diagnosis not present

## 2016-06-09 DIAGNOSIS — Z808 Family history of malignant neoplasm of other organs or systems: Secondary | ICD-10-CM | POA: Diagnosis not present

## 2016-06-09 DIAGNOSIS — Z86018 Personal history of other benign neoplasm: Secondary | ICD-10-CM | POA: Diagnosis not present

## 2016-06-10 ENCOUNTER — Telehealth: Payer: Self-pay | Admitting: *Deleted

## 2016-06-10 ENCOUNTER — Encounter: Payer: Self-pay | Admitting: Podiatry

## 2016-06-10 ENCOUNTER — Ambulatory Visit (INDEPENDENT_AMBULATORY_CARE_PROVIDER_SITE_OTHER): Payer: BLUE CROSS/BLUE SHIELD | Admitting: Podiatry

## 2016-06-10 VITALS — BP 119/74 | HR 73 | Resp 18

## 2016-06-10 DIAGNOSIS — M205X1 Other deformities of toe(s) (acquired), right foot: Secondary | ICD-10-CM | POA: Diagnosis not present

## 2016-06-10 NOTE — Progress Notes (Signed)
She presents today for follow-up of questions and answers regarding having surgery to her right foot in the spring. She had several questions regarding how the osteotomy would be performed how long it would take to heal or the pros and cons of this versus a joint replacement or her hallux limitus.  Objective: Vital signs are stable she is alert and oriented 3. Pulses are strongly palpable. She still has pain on range of motion of the first metatarsophalangeal joint with mild edema and a large dorsal spur.  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  Plan: Discussed etiology pathology conservative versus surgical therapies. At this point performed no additional procedures we did dispense a cam walker for her and provided her with instructions for the surgery center.

## 2016-06-10 NOTE — Telephone Encounter (Signed)
"  I'm scheduled for surgery on 07/30/2016.  I'm calling to see if we can move surgery to 07/23/2016."  No, Dr. Milinda Pointer is not going to be in the office that day.  "Well can he do it any other day, March 16?"  No, he cannot do it on March 16.  His schedule is full.  He can't do it on March 23 nor March 30.  "Okay, just leave it where it is."

## 2016-06-23 DIAGNOSIS — M79642 Pain in left hand: Secondary | ICD-10-CM | POA: Diagnosis not present

## 2016-06-23 DIAGNOSIS — M1812 Unilateral primary osteoarthritis of first carpometacarpal joint, left hand: Secondary | ICD-10-CM | POA: Diagnosis not present

## 2016-07-21 ENCOUNTER — Telehealth: Payer: Self-pay | Admitting: *Deleted

## 2016-07-21 NOTE — Telephone Encounter (Addendum)
Pt stats she is scheduled for surgery with Dr. Milinda Pointer on 07/30/2016 and has questions. Pt asked if she is in the large boot post op will she need crutches, and she read the pre-op paperwork and she said that if surgery was to be performed in the hospital pt would need a history and physical 1 month prior, and she assumes she is to have her surgery at a surgery center not hospital.08/03/2016-Pt asked if she is to build up to the 15 minutes/hour weight bearing after surgery or is that the window of time she can be up.I informed pt it was a guideline for not over doing the weight bearing or having the foot below the heart. Pt states understanding.08/20/2016-Pt called states she is doing her toe PT as ordered by Dr. Milinda Pointer, but would like to know how far she needs to go up and down. 08/23/2016-I informed pt of Dr. Stephenie Acres ROM exercise. 09/15/2016-Pt states was seen yesterday by Dr. Milinda Pointer, but forgot to ask about shoe wear. Pt states Dr. Milinda Pointer had said she could wear athletic shoes, or open heeled mules, but what about flip-flops or barefooted. I told pt our doctors don't recommend flip-flop style sandal for long-periods of time and barefooted gives not protection or support. Pt states understanding.

## 2016-07-23 NOTE — Telephone Encounter (Signed)
I'm returning your call.  Your surgery will be at [facility] and you will not need crutches.  "I just wanted to make sure."

## 2016-07-26 DIAGNOSIS — M79645 Pain in left finger(s): Secondary | ICD-10-CM | POA: Diagnosis not present

## 2016-07-26 DIAGNOSIS — M1812 Unilateral primary osteoarthritis of first carpometacarpal joint, left hand: Secondary | ICD-10-CM | POA: Diagnosis not present

## 2016-07-28 ENCOUNTER — Other Ambulatory Visit: Payer: Self-pay | Admitting: Podiatry

## 2016-07-28 MED ORDER — CEPHALEXIN 500 MG PO CAPS: 500.0000 mg | ORAL_CAPSULE | Freq: Three times a day (TID) | ORAL | 0 refills | Status: DC

## 2016-07-28 MED ORDER — HYDROMORPHONE HCL 4 MG PO TABS: 4.0000 mg | ORAL_TABLET | ORAL | 0 refills | Status: DC | PRN

## 2016-07-28 MED ORDER — ONDANSETRON HCL 4 MG PO TABS: 4.0000 mg | ORAL_TABLET | Freq: Three times a day (TID) | ORAL | 0 refills | Status: DC | PRN

## 2016-07-30 ENCOUNTER — Encounter: Payer: Self-pay | Admitting: Podiatry

## 2016-07-30 DIAGNOSIS — M25571 Pain in right ankle and joints of right foot: Secondary | ICD-10-CM | POA: Diagnosis not present

## 2016-07-30 DIAGNOSIS — M21611 Bunion of right foot: Secondary | ICD-10-CM | POA: Diagnosis not present

## 2016-07-30 DIAGNOSIS — E7800 Pure hypercholesterolemia, unspecified: Secondary | ICD-10-CM | POA: Diagnosis not present

## 2016-07-30 DIAGNOSIS — M2011 Hallux valgus (acquired), right foot: Secondary | ICD-10-CM | POA: Diagnosis not present

## 2016-08-05 ENCOUNTER — Ambulatory Visit (INDEPENDENT_AMBULATORY_CARE_PROVIDER_SITE_OTHER): Payer: BLUE CROSS/BLUE SHIELD

## 2016-08-05 ENCOUNTER — Encounter: Payer: Self-pay | Admitting: Podiatry

## 2016-08-05 ENCOUNTER — Ambulatory Visit (INDEPENDENT_AMBULATORY_CARE_PROVIDER_SITE_OTHER): Payer: BLUE CROSS/BLUE SHIELD | Admitting: Podiatry

## 2016-08-05 ENCOUNTER — Ambulatory Visit: Payer: BLUE CROSS/BLUE SHIELD

## 2016-08-05 VITALS — HR 98

## 2016-08-05 DIAGNOSIS — M779 Enthesopathy, unspecified: Secondary | ICD-10-CM

## 2016-08-05 DIAGNOSIS — Z9889 Other specified postprocedural states: Secondary | ICD-10-CM

## 2016-08-05 DIAGNOSIS — M205X1 Other deformities of toe(s) (acquired), right foot: Secondary | ICD-10-CM

## 2016-08-05 NOTE — Progress Notes (Signed)
She presents today 1 week status post osteotomy and osteotomy date of surgery 07/30/2016 right foot. She states that she's been doing pretty well.  Objective: Vital signs are stable alert and oriented 3. Pulses are palpable. Dry sterile dressing intact was removed demonstrate minimal edema no erythema cellulitis drainage or odor. Suture is intact margins well coapted she has great range of motion of the first metatarsophalangeal joint passive and actively. Radiographs taken today demonstrate I osteotomy of the first metatarsal intact with good internal fixation in good position.  Assessment: Well-healed surgical foot right.  Plan: [place] today dresser compressive dressing follow-up with me in 1 week.

## 2016-08-12 ENCOUNTER — Ambulatory Visit (INDEPENDENT_AMBULATORY_CARE_PROVIDER_SITE_OTHER): Payer: BLUE CROSS/BLUE SHIELD | Admitting: Podiatry

## 2016-08-12 DIAGNOSIS — M205X1 Other deformities of toe(s) (acquired), right foot: Secondary | ICD-10-CM | POA: Diagnosis not present

## 2016-08-12 DIAGNOSIS — Z9889 Other specified postprocedural states: Secondary | ICD-10-CM

## 2016-08-12 NOTE — Progress Notes (Signed)
She presents today for follow-up of her osteotomy and osteotomy first metatarsophalangeal joint of the right foot. She states that she seems to be doing pretty well date of surgery 07/30/2016. She states that is still tender.  Objective: Vital signs are stable she is alert and oriented 3. Pulses are palpable. She has good range of motion with dorsiflexion she is limited to rectus on plantarflexion. Minimal edema no cellulitis or drainage or odor incision sites onto heal uneventfully.  Assessment: 1 on surgical foot.  Plan: Encouraged range of motion exercises and discussed them with her today. Demonstrated them as well. I will follow-up with her in 1-2 weeks. I did put her in a compression anklet and a Darco shoe.

## 2016-08-23 NOTE — Telephone Encounter (Signed)
Move to end range of motion then hold with gentle pressure for 15 sec then release and go in the opposite direction.  Do this as often as possible.

## 2016-08-26 ENCOUNTER — Ambulatory Visit (INDEPENDENT_AMBULATORY_CARE_PROVIDER_SITE_OTHER): Payer: BLUE CROSS/BLUE SHIELD

## 2016-08-26 ENCOUNTER — Ambulatory Visit (INDEPENDENT_AMBULATORY_CARE_PROVIDER_SITE_OTHER): Payer: Self-pay | Admitting: Podiatry

## 2016-08-26 DIAGNOSIS — Z9889 Other specified postprocedural states: Secondary | ICD-10-CM | POA: Diagnosis not present

## 2016-08-26 DIAGNOSIS — M205X1 Other deformities of toe(s) (acquired), right foot: Secondary | ICD-10-CM

## 2016-08-29 NOTE — Progress Notes (Signed)
She presents today for follow-up of her [place] bunionectomy right foot. Date of surgery 07/30/2016. She states that this right foot is doing very well.  Objective: Vital signs are stable she is alert and oriented 3 pulses are palpable. She has great range of motion of the first metatarsophalangeal joint slightly limited on plantarflexion but she is working on this and seems to be getting better.  Assessment: While healing surgical foot.  Plan: Increased activity and will allow her to get back into her regular shoe gear and I will follow-up with her in 3-4 weeks.

## 2016-09-07 NOTE — Progress Notes (Signed)
[place] bunionectomy right foot , surgical cutting & repositioning with screw fixation with removal of bone spur

## 2016-09-08 DIAGNOSIS — H5213 Myopia, bilateral: Secondary | ICD-10-CM | POA: Diagnosis not present

## 2016-09-14 ENCOUNTER — Ambulatory Visit (INDEPENDENT_AMBULATORY_CARE_PROVIDER_SITE_OTHER): Payer: Self-pay | Admitting: Podiatry

## 2016-09-14 ENCOUNTER — Encounter: Payer: Self-pay | Admitting: Podiatry

## 2016-09-14 ENCOUNTER — Ambulatory Visit (INDEPENDENT_AMBULATORY_CARE_PROVIDER_SITE_OTHER): Payer: BLUE CROSS/BLUE SHIELD

## 2016-09-14 DIAGNOSIS — M205X1 Other deformities of toe(s) (acquired), right foot: Secondary | ICD-10-CM

## 2016-09-14 NOTE — Progress Notes (Signed)
She presents today 6 weeks status post bunion repair right foot. She states that she started to have pain here and she points to the hallux interphalangeal joint of the right foot.  Objective: Vital signs are stable she is alert and oriented 3. She has restriction on plantarflexion of the first metatarsophalangeal joint status post [facility]. She had has pain on range of motion of the hallux interphalangeal joint. Radiographs demonstrate hallux malleus developing. This is more than likely secondary to the inability to plantarflex the first metatarsophalangeal joint hopefully this is just scar tissue.  Assessment: Well-healing osteotomy however results are now resulting in hallux malleus.  Plan: I will send her to physical therapy to try to gain the upper hand on plantarflexion before he will become necessary perform a hallux interphalangeal joint fusion.

## 2016-09-16 DIAGNOSIS — M25572 Pain in left ankle and joints of left foot: Secondary | ICD-10-CM | POA: Diagnosis not present

## 2016-09-16 DIAGNOSIS — M2012 Hallux valgus (acquired), left foot: Secondary | ICD-10-CM | POA: Diagnosis not present

## 2016-09-16 DIAGNOSIS — M25675 Stiffness of left foot, not elsewhere classified: Secondary | ICD-10-CM | POA: Diagnosis not present

## 2016-09-16 DIAGNOSIS — R262 Difficulty in walking, not elsewhere classified: Secondary | ICD-10-CM | POA: Diagnosis not present

## 2016-09-20 DIAGNOSIS — R262 Difficulty in walking, not elsewhere classified: Secondary | ICD-10-CM | POA: Diagnosis not present

## 2016-09-20 DIAGNOSIS — M25675 Stiffness of left foot, not elsewhere classified: Secondary | ICD-10-CM | POA: Diagnosis not present

## 2016-09-20 DIAGNOSIS — M2012 Hallux valgus (acquired), left foot: Secondary | ICD-10-CM | POA: Diagnosis not present

## 2016-09-20 DIAGNOSIS — M25572 Pain in left ankle and joints of left foot: Secondary | ICD-10-CM | POA: Diagnosis not present

## 2016-09-22 DIAGNOSIS — R262 Difficulty in walking, not elsewhere classified: Secondary | ICD-10-CM | POA: Diagnosis not present

## 2016-09-22 DIAGNOSIS — M25675 Stiffness of left foot, not elsewhere classified: Secondary | ICD-10-CM | POA: Diagnosis not present

## 2016-09-22 DIAGNOSIS — M2012 Hallux valgus (acquired), left foot: Secondary | ICD-10-CM | POA: Diagnosis not present

## 2016-09-22 DIAGNOSIS — M25572 Pain in left ankle and joints of left foot: Secondary | ICD-10-CM | POA: Diagnosis not present

## 2016-09-24 DIAGNOSIS — M25572 Pain in left ankle and joints of left foot: Secondary | ICD-10-CM | POA: Diagnosis not present

## 2016-09-24 DIAGNOSIS — M2012 Hallux valgus (acquired), left foot: Secondary | ICD-10-CM | POA: Diagnosis not present

## 2016-09-24 DIAGNOSIS — M25675 Stiffness of left foot, not elsewhere classified: Secondary | ICD-10-CM | POA: Diagnosis not present

## 2016-09-24 DIAGNOSIS — R262 Difficulty in walking, not elsewhere classified: Secondary | ICD-10-CM | POA: Diagnosis not present

## 2016-09-28 DIAGNOSIS — R262 Difficulty in walking, not elsewhere classified: Secondary | ICD-10-CM | POA: Diagnosis not present

## 2016-09-28 DIAGNOSIS — M25675 Stiffness of left foot, not elsewhere classified: Secondary | ICD-10-CM | POA: Diagnosis not present

## 2016-09-28 DIAGNOSIS — M2012 Hallux valgus (acquired), left foot: Secondary | ICD-10-CM | POA: Diagnosis not present

## 2016-09-28 DIAGNOSIS — M25572 Pain in left ankle and joints of left foot: Secondary | ICD-10-CM | POA: Diagnosis not present

## 2016-09-29 ENCOUNTER — Telehealth: Payer: Self-pay | Admitting: *Deleted

## 2016-09-29 NOTE — Telephone Encounter (Signed)
I'm calling regarding you surgery scheduled for 10/29/2016.  "I don't have any upcoming surgery scheduled.  I had my surgery in April.  I may have been scheduled for that date previously."  I'm sorry, I forgot to take it out of the scheduling book.

## 2016-09-30 DIAGNOSIS — R262 Difficulty in walking, not elsewhere classified: Secondary | ICD-10-CM | POA: Diagnosis not present

## 2016-09-30 DIAGNOSIS — M2012 Hallux valgus (acquired), left foot: Secondary | ICD-10-CM | POA: Diagnosis not present

## 2016-09-30 DIAGNOSIS — M25675 Stiffness of left foot, not elsewhere classified: Secondary | ICD-10-CM | POA: Diagnosis not present

## 2016-09-30 DIAGNOSIS — M25572 Pain in left ankle and joints of left foot: Secondary | ICD-10-CM | POA: Diagnosis not present

## 2016-10-05 DIAGNOSIS — R262 Difficulty in walking, not elsewhere classified: Secondary | ICD-10-CM | POA: Diagnosis not present

## 2016-10-05 DIAGNOSIS — M2012 Hallux valgus (acquired), left foot: Secondary | ICD-10-CM | POA: Diagnosis not present

## 2016-10-05 DIAGNOSIS — M25572 Pain in left ankle and joints of left foot: Secondary | ICD-10-CM | POA: Diagnosis not present

## 2016-10-05 DIAGNOSIS — M25675 Stiffness of left foot, not elsewhere classified: Secondary | ICD-10-CM | POA: Diagnosis not present

## 2016-10-07 ENCOUNTER — Encounter: Payer: Self-pay | Admitting: Podiatry

## 2016-10-07 ENCOUNTER — Ambulatory Visit (INDEPENDENT_AMBULATORY_CARE_PROVIDER_SITE_OTHER): Payer: Self-pay | Admitting: Podiatry

## 2016-10-07 ENCOUNTER — Ambulatory Visit (INDEPENDENT_AMBULATORY_CARE_PROVIDER_SITE_OTHER): Payer: BLUE CROSS/BLUE SHIELD

## 2016-10-07 DIAGNOSIS — M205X1 Other deformities of toe(s) (acquired), right foot: Secondary | ICD-10-CM

## 2016-10-07 NOTE — Progress Notes (Signed)
She presents today for follow-up of her first metatarsal osteotomy right foot. She states that physical therapy is really helping with the dorsiflexion and plantarflexion and helping to take the soreness away.  Objective: She has good dorsiflexion or plantar flexion is almost neutral to 1-2 of plantarflexion. I would like to see more plantarflexion if possible.  Assessment: The well-healing osteotomy.  Plan: Continue physical therapy to gain about 5 of plantarflexion. Follow-up with me as needed.

## 2016-10-08 DIAGNOSIS — M25675 Stiffness of left foot, not elsewhere classified: Secondary | ICD-10-CM | POA: Diagnosis not present

## 2016-10-08 DIAGNOSIS — R262 Difficulty in walking, not elsewhere classified: Secondary | ICD-10-CM | POA: Diagnosis not present

## 2016-10-08 DIAGNOSIS — M25572 Pain in left ankle and joints of left foot: Secondary | ICD-10-CM | POA: Diagnosis not present

## 2016-10-08 DIAGNOSIS — M2012 Hallux valgus (acquired), left foot: Secondary | ICD-10-CM | POA: Diagnosis not present

## 2016-10-11 DIAGNOSIS — M25572 Pain in left ankle and joints of left foot: Secondary | ICD-10-CM | POA: Diagnosis not present

## 2016-10-11 DIAGNOSIS — R262 Difficulty in walking, not elsewhere classified: Secondary | ICD-10-CM | POA: Diagnosis not present

## 2016-10-11 DIAGNOSIS — M25675 Stiffness of left foot, not elsewhere classified: Secondary | ICD-10-CM | POA: Diagnosis not present

## 2016-10-11 DIAGNOSIS — M2012 Hallux valgus (acquired), left foot: Secondary | ICD-10-CM | POA: Diagnosis not present

## 2016-10-13 DIAGNOSIS — M25572 Pain in left ankle and joints of left foot: Secondary | ICD-10-CM | POA: Diagnosis not present

## 2016-10-13 DIAGNOSIS — R262 Difficulty in walking, not elsewhere classified: Secondary | ICD-10-CM | POA: Diagnosis not present

## 2016-10-13 DIAGNOSIS — M2012 Hallux valgus (acquired), left foot: Secondary | ICD-10-CM | POA: Diagnosis not present

## 2016-10-13 DIAGNOSIS — M25675 Stiffness of left foot, not elsewhere classified: Secondary | ICD-10-CM | POA: Diagnosis not present

## 2016-11-01 DIAGNOSIS — M25675 Stiffness of left foot, not elsewhere classified: Secondary | ICD-10-CM | POA: Diagnosis not present

## 2016-11-01 DIAGNOSIS — M2012 Hallux valgus (acquired), left foot: Secondary | ICD-10-CM | POA: Diagnosis not present

## 2016-11-01 DIAGNOSIS — R262 Difficulty in walking, not elsewhere classified: Secondary | ICD-10-CM | POA: Diagnosis not present

## 2016-11-01 DIAGNOSIS — M25572 Pain in left ankle and joints of left foot: Secondary | ICD-10-CM | POA: Diagnosis not present

## 2016-12-09 ENCOUNTER — Ambulatory Visit: Payer: BLUE CROSS/BLUE SHIELD | Admitting: Podiatry

## 2016-12-16 ENCOUNTER — Encounter: Payer: Self-pay | Admitting: Podiatry

## 2016-12-16 ENCOUNTER — Ambulatory Visit (INDEPENDENT_AMBULATORY_CARE_PROVIDER_SITE_OTHER): Payer: BLUE CROSS/BLUE SHIELD | Admitting: Podiatry

## 2016-12-16 ENCOUNTER — Ambulatory Visit: Payer: BLUE CROSS/BLUE SHIELD

## 2016-12-16 DIAGNOSIS — M205X1 Other deformities of toe(s) (acquired), right foot: Secondary | ICD-10-CM

## 2016-12-16 DIAGNOSIS — Z9889 Other specified postprocedural states: Secondary | ICD-10-CM

## 2016-12-16 NOTE — Progress Notes (Signed)
She presents today stating that she was snowshoeing this needs to do anything else to this toe. Date of surgery was 07/30/2016 were also was performed. She had osteoarthritis of the first metatarsophalangeal joint and an Liane Comber bunion was performed shortening it. She had great range of motion on dorsiflexion with minimal pain however she has since developed soreness on the plantar aspect of the foot. She has been to physical therapy to no avail.  Objective: Vital signs are stable she is alert and oriented 3. Pulses are palpable. She has great dorsiflexion of the first metatarsophalangeal joint but it is limited to neutral on plantarflexion because of adhesions of the sesamoidal apparatus. She has started to develop a small malleus deformity of the hallux. Radiographs were not taken today.  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  Plan: Discussed etiology pathology conservative versus surgical therapies at this point I highly recommended that we wait another couple of months just to evaluate the progression of breakdown of the scar tissue. I explained to her that this needs to have at the very least screw removal and release of the first metatarsophalangeal joint. She understands this and is amenable to it and will follow-up with me in a couple of months. Radiographs will need to be taken at that time.

## 2016-12-29 DIAGNOSIS — L814 Other melanin hyperpigmentation: Secondary | ICD-10-CM | POA: Diagnosis not present

## 2016-12-29 DIAGNOSIS — Z808 Family history of malignant neoplasm of other organs or systems: Secondary | ICD-10-CM | POA: Diagnosis not present

## 2016-12-29 DIAGNOSIS — L570 Actinic keratosis: Secondary | ICD-10-CM | POA: Diagnosis not present

## 2016-12-29 DIAGNOSIS — Z85828 Personal history of other malignant neoplasm of skin: Secondary | ICD-10-CM | POA: Diagnosis not present

## 2016-12-29 DIAGNOSIS — Z86018 Personal history of other benign neoplasm: Secondary | ICD-10-CM | POA: Diagnosis not present

## 2017-01-06 ENCOUNTER — Encounter: Payer: Self-pay | Admitting: Family Medicine

## 2017-02-01 ENCOUNTER — Ambulatory Visit: Payer: BLUE CROSS/BLUE SHIELD | Admitting: Podiatry

## 2017-04-26 DIAGNOSIS — E2839 Other primary ovarian failure: Secondary | ICD-10-CM | POA: Diagnosis not present

## 2017-04-26 DIAGNOSIS — Z1389 Encounter for screening for other disorder: Secondary | ICD-10-CM | POA: Diagnosis not present

## 2017-04-26 DIAGNOSIS — Z6824 Body mass index (BMI) 24.0-24.9, adult: Secondary | ICD-10-CM | POA: Diagnosis not present

## 2017-04-26 DIAGNOSIS — Z130 Encounter for screening for diseases of the blood and blood-forming organs and certain disorders involving the immune mechanism: Secondary | ICD-10-CM | POA: Diagnosis not present

## 2017-04-26 DIAGNOSIS — Z01419 Encounter for gynecological examination (general) (routine) without abnormal findings: Secondary | ICD-10-CM | POA: Diagnosis not present

## 2017-04-26 DIAGNOSIS — N952 Postmenopausal atrophic vaginitis: Secondary | ICD-10-CM | POA: Diagnosis not present

## 2017-04-27 ENCOUNTER — Other Ambulatory Visit: Payer: Self-pay | Admitting: Obstetrics and Gynecology

## 2017-04-27 DIAGNOSIS — E2839 Other primary ovarian failure: Secondary | ICD-10-CM

## 2017-04-28 ENCOUNTER — Encounter: Payer: Self-pay | Admitting: Family Medicine

## 2017-05-13 ENCOUNTER — Encounter: Payer: Self-pay | Admitting: Family Medicine

## 2017-05-17 ENCOUNTER — Inpatient Hospital Stay
Admission: RE | Admit: 2017-05-17 | Discharge: 2017-05-17 | Disposition: A | Discharge status: Home / Self Care | Payer: BLUE CROSS/BLUE SHIELD | Source: Ambulatory Visit | Attending: Obstetrics and Gynecology | Admitting: Obstetrics and Gynecology

## 2017-05-23 DIAGNOSIS — Z1231 Encounter for screening mammogram for malignant neoplasm of breast: Secondary | ICD-10-CM | POA: Diagnosis not present

## 2017-05-30 ENCOUNTER — Ambulatory Visit
Admission: RE | Admit: 2017-05-30 | Discharge: 2017-05-30 | Disposition: A | Discharge status: Home / Self Care | Payer: BLUE CROSS/BLUE SHIELD | Source: Ambulatory Visit | Attending: Obstetrics and Gynecology | Admitting: Obstetrics and Gynecology

## 2017-05-30 DIAGNOSIS — Z780 Asymptomatic menopausal state: Secondary | ICD-10-CM | POA: Diagnosis not present

## 2017-05-30 DIAGNOSIS — E2839 Other primary ovarian failure: Secondary | ICD-10-CM

## 2017-05-30 DIAGNOSIS — M8589 Other specified disorders of bone density and structure, multiple sites: Secondary | ICD-10-CM | POA: Diagnosis not present

## 2017-06-28 DIAGNOSIS — Z86018 Personal history of other benign neoplasm: Secondary | ICD-10-CM | POA: Diagnosis not present

## 2017-06-28 DIAGNOSIS — D2272 Melanocytic nevi of left lower limb, including hip: Secondary | ICD-10-CM | POA: Diagnosis not present

## 2017-06-28 DIAGNOSIS — D2230 Melanocytic nevi of unspecified part of face: Secondary | ICD-10-CM | POA: Diagnosis not present

## 2017-07-30 NOTE — Progress Notes (Signed)
HPI:  Using dictation device. Unfortunately this device frequently misinterprets words/phrases.  Here for CPE: Due for labs, hep c, ?mammo, ? Pap, ? colon -Concerns and/or follow up today: none  Not seen here in about 3 years since she established care. Sees Joycelyn Rua for gyn care.  Sees dermatologist every 6 months for skin exam.  -Diet: variety of foods, balance and well rounded, larger portion sizes -Exercise: no regular exercise -Taking folic acid, vitamin D or calcium: no -Diabetes and Dyslipidemia Screening: fasting for labs -Vaccines: see vaccine section EPIC -pap history: reports done with gyn -FDLMP: see nursing notes -sexual activity: yes, female partner, no new partners -wants STI testing (Hep C if born [birth year]): no -FH breast, colon or ovarian ca: see FH Last mammogram: reports done with gyn annually Last colon cancer screening: colonosocpy in 2013, no polyps per report, but 5 year screening advised, [facility] - not sure why 5 year recommendation made per report Breast Ca Risk Assessment: see family history and pt history DEXA (>/= 65): n/a  -Alcohol, Tobacco, drug use: see social history  Review of Systems - no fevers, unintentional weight loss, vision loss, hearing loss, chest pain, sob, hemoptysis, melena, hematochezia, hematuria, genital discharge, changing or concerning skin lesions, bleeding, bruising, loc, thoughts of self harm or SI  Past Medical History:  Diagnosis Date  . Arthritis   . Basal cell carcinoma 08/13/2010   sees dermatologist  . Genital warts   . Hot flashes    and vaginal atrophy - sees Dr. Marvel Plan  . IBS (irritable bowel syndrome)   . Kidney stones   . Right ear pain   . Rotator cuff syndrome of left shoulder 08/13/2010    Past Surgical History:  Procedure Laterality Date  . ABDOMINAL HYSTERECTOMY    . APPENDECTOMY    . BASAL CELL CARCINOMA EXCISION    . WISDOM TOOTH EXTRACTION      Family History  Problem Relation Age of  Onset  . Hypertension Mother        Living  . Arthritis Father   . Hyperlipidemia Father 31       Deceased  . Heart disease Father 19       MI  . Hyperlipidemia Sister   . Hypertension Sister   . Stroke Maternal Grandmother   . Alcohol abuse Maternal Grandfather   . Stroke Paternal Grandmother   . Heart disease Paternal Grandfather   . Hypertension Sister        x1  . Healthy Sister        x4  . Arthritis Sister        x1    Social History   Socioeconomic History  . Marital status: Married    Spouse name: Not on file  . Number of children: Not on file  . Years of education: Not on file  . Highest education level: Not on file  Occupational History  . Not on file  Social Needs  . Financial resource strain: Not on file  . Food insecurity:    Worry: Not on file    Inability: Not on file  . Transportation needs:    Medical: Not on file    Non-medical: Not on file  Tobacco Use  . Smoking status: Never Smoker  . Smokeless tobacco: Never Used  Substance and Sexual Activity  . Alcohol use: Yes    Comment: 1-2 glasses of wine once per week  . Drug use: No  . Sexual activity: Not on  file  Lifestyle  . Physical activity:    Days per week: Not on file    Minutes per session: Not on file  . Stress: Not on file  Relationships  . Social connections:    Talks on phone: Not on file    Gets together: Not on file    Attends religious service: Not on file    Active member of club or organization: Not on file    Attends meetings of clubs or organizations: Not on file    Relationship status: Not on file  Other Topics Concern  . Not on file  Social History Narrative   Work or School: works part time preschool      Home Situation: lives with husband and son is in college in 2016      Spiritual Beliefs: Christian      Lifestyle: walking and yoga; diet is healthy        Current Outpatient Medications:  .  Cholecalciferol (VITAMIN D-3) 1000 UNITS CAPS, Take by mouth  daily., Disp: , Rfl:  .  estradiol (ESTRACE) 0.5 MG tablet, Take 0.5 mg by mouth once a week. , Disp: , Rfl: 11 .  Multiple Vitamin (MULTIVITAMIN) tablet, Take 1 tablet by mouth daily., Disp: , Rfl:  .  nystatin-triamcinolone ointment (MYCOLOG), APPLY TO AFFECTED AREA TWICE DAILY AS DIRECTED, Disp: , Rfl: 5  EXAM:  Vitals:   08/01/17 0811 08/01/17 0820  BP: (!) 80/60 100/68  Pulse: 72   Temp: 97.9 F (36.6 C)     GENERAL: vitals reviewed and listed below, alert, oriented, appears well hydrated and in no acute distress  HEENT: head atraumatic, PERRLA, normal appearance of eyes, ears, nose and mouth. moist mucus membranes.  NECK: supple, no masses or lymphadenopathy  LUNGS: clear to auscultation bilaterally, no rales, rhonchi or wheeze  CV: HRRR, no peripheral edema or cyanosis, normal pedal pulses  ABDOMEN: bowel sounds normal, soft, non tender to palpation, no masses, no rebound or guarding  GU/BREAST: declined, does with gyn  SKIN: no rash or abnormal lesions - declined full skin exam, done with derm  MS: normal gait, moves all extremities normally  NEURO: normal gait, speech and thought processing grossly intact, muscle tone grossly intact throughout  PSYCH: normal affect, pleasant and cooperative  ASSESSMENT AND PLAN:  Discussed the following assessment and plan:  PREVENTIVE EXAM: -Discussed and advised all [place] preventive services health task force level A and B recommendations for age, sex and risks. -Advised at least 150 minutes of exercise per week and a healthy diet with avoidance of (less then 1 serving per week) processed foods, white starches, red meat, fast foods and sweets and consisting of: * 5-9 servings of fresh fruits and vegetables (not corn or potatoes) *nuts and seeds, beans *olives and olive oil *lean meats such as fish and white chicken  *whole grains -labs, studies and vaccines per orders this encounter  Advised assistant to contact GI office to  check on when colonoscopy due and let pt know and update epic if needed. Advised to put not in Epic.  Patient advised to return to clinic immediately if symptoms worsen or persist or new concerns.  Patient Instructions  BEFORE YOU LEAVE: -labs -follow up: yearly for CPE  We have ordered labs or studies at this visit. It can take up to 1-2 weeks for results and processing. IF results require follow up or explanation, we will call you with instructions. Clinically stable results will be released to your  MYCHART. If you have not heard from [place] or cannot find your results in [facility] in 2 weeks please contact our office at [phone].  If you are not yet signed up for [facility], please consider signing up.        Preventive Care 40-64 Years, Female Preventive care refers to lifestyle choices and visits with your health care provider that can promote health and wellness. What does preventive care include?  A yearly physical exam. This is also called an annual well check.  Dental exams once or twice a year.  Routine eye exams. Ask your health care provider how often you should have your eyes checked.  Personal lifestyle choices, including: ? Daily care of your teeth and gums. ? Regular physical activity. ? Eating a healthy diet. ? Avoiding tobacco and drug use. ? Limiting alcohol use. ? Practicing safe sex. ? Taking low-dose aspirin daily starting at age 50. ? Taking vitamin and mineral supplements as recommended by your health care provider. What happens during an annual well check? The services and screenings done by your health care provider during your annual well check will depend on your age, overall health, lifestyle risk factors, and family history of disease. Counseling Your health care provider may ask you questions about your:  Alcohol use.  Tobacco use.  Drug use.  Emotional well-being.  Home and relationship well-being.  Sexual activity.  Eating  habits.  Work and work Statistician.  Method of birth control.  Menstrual cycle.  Pregnancy history.  Screening You may have the following tests or measurements:  Height, weight, and BMI.  Blood pressure.  Lipid and cholesterol levels. These may be checked every 5 years, or more frequently if you are over 51 years old.  Skin check.  Lung cancer screening. You may have this screening every year starting at age 48 if you have a 30-pack-year history of smoking and currently smoke or have quit within the past 15 years.  Fecal occult blood test (FOBT) of the stool. You may have this test every year starting at age 21.  Flexible sigmoidoscopy or colonoscopy. You may have a sigmoidoscopy every 5 years or a colonoscopy every 10 years starting at age 91.  Hepatitis C blood test.  Hepatitis B blood test.  Sexually transmitted disease (STD) testing.  Diabetes screening. This is done by checking your blood sugar (glucose) after you have not eaten for a while (fasting). You may have this done every 1-3 years.  Mammogram. This may be done every 1-2 years. Talk to your health care provider about when you should start having regular mammograms. This may depend on whether you have a family history of breast cancer.  BRCA-related cancer screening. This may be done if you have a family history of breast, ovarian, tubal, or peritoneal cancers.  Pelvic exam and Pap test. This may be done every 3 years starting at age 11. Starting at age 54, this may be done every 5 years if you have a Pap test in combination with an HPV test.  Bone density scan. This is done to screen for osteoporosis. You may have this scan if you are at high risk for osteoporosis.  Discuss your test results, treatment options, and if necessary, the need for more tests with your health care provider. Vaccines Your health care provider may recommend certain vaccines, such as:  Influenza vaccine. This is recommended every  year.  Tetanus, diphtheria, and acellular pertussis (Tdap, Td) vaccine. You may need a Td booster every  10 years.  Varicella vaccine. You may need this if you have not been vaccinated.  Zoster vaccine. You may need this after age 91.  Measles, mumps, and rubella (MMR) vaccine. You may need at least one dose of MMR if you were born in 1957 or later. You may also need a second dose.  Pneumococcal 13-valent conjugate (PCV13) vaccine. You may need this if you have certain conditions and were not previously vaccinated.  Pneumococcal polysaccharide (PPSV23) vaccine. You may need one or two doses if you smoke cigarettes or if you have certain conditions.  Meningococcal vaccine. You may need this if you have certain conditions.  Hepatitis A vaccine. You may need this if you have certain conditions or if you travel or work in places where you may be exposed to hepatitis A.  Hepatitis B vaccine. You may need this if you have certain conditions or if you travel or work in places where you may be exposed to hepatitis B.  Haemophilus influenzae type b (Hib) vaccine. You may need this if you have certain conditions.  Talk to your health care provider about which screenings and vaccines you need and how often you need them. This information is not intended to replace advice given to you by your health care provider. Make sure you discuss any questions you have with your health care provider. Document Released: 05/02/2015 Document Revised: 12/24/2015 Document Reviewed: 02/04/2015 Elsevier Interactive Patient Education  2018 [employer].     No follow-ups on file.  Lucretia Kern, DO

## 2017-08-01 ENCOUNTER — Encounter: Payer: Self-pay | Admitting: Family Medicine

## 2017-08-01 ENCOUNTER — Telehealth: Payer: Self-pay | Admitting: *Deleted

## 2017-08-01 ENCOUNTER — Ambulatory Visit (INDEPENDENT_AMBULATORY_CARE_PROVIDER_SITE_OTHER): Payer: BLUE CROSS/BLUE SHIELD | Admitting: Family Medicine

## 2017-08-01 VITALS — BP 100/68 | HR 72 | Temp 97.9°F | Ht 66.0 in | Wt 146.6 lb

## 2017-08-01 DIAGNOSIS — Z0000 Encounter for general adult medical examination without abnormal findings: Secondary | ICD-10-CM | POA: Diagnosis not present

## 2017-08-01 DIAGNOSIS — Z1331 Encounter for screening for depression: Secondary | ICD-10-CM

## 2017-08-01 DIAGNOSIS — Z1159 Encounter for screening for other viral diseases: Secondary | ICD-10-CM | POA: Diagnosis not present

## 2017-08-01 DIAGNOSIS — E785 Hyperlipidemia, unspecified: Secondary | ICD-10-CM | POA: Diagnosis not present

## 2017-08-01 LAB — LIPID PANEL
Cholesterol: 221 mg/dL — ABNORMAL HIGH (ref 0–200)
HDL: 70.6 mg/dL (ref >39.00)
LDL Cholesterol: 125 mg/dL — ABNORMAL HIGH (ref 0–99)
NonHDL: 149.97
Total CHOL/HDL Ratio: 3
Triglycerides: 124 mg/dL (ref 0.0–149.0)
VLDL: 24.8 mg/dL (ref 0.0–40.0)

## 2017-08-01 LAB — HEMOGLOBIN A1C: Hgb A1c MFr Bld: 5.6 % (ref 4.6–6.5)

## 2017-08-01 NOTE — Telephone Encounter (Signed)
Per Dr Maudie Mercury, I called [facility] at [phone] and spoke with Lenna Sciara, Dr Kathline Magic CMA.  Melissa stated a repeat colonoscopy was recommended in 5 years instead of 10 years due to the pt stating her mother had polyps.  She also stated a reminder letter was sent to the pt in February 2018.  Message sent to Dr Maudie Mercury.

## 2017-08-01 NOTE — Patient Instructions (Signed)
BEFORE YOU LEAVE: -labs -follow up: yearly for CPE  We have ordered labs or studies at this visit. It can take up to 1-2 weeks for results and processing. IF results require follow up or explanation, we will call you with instructions. Clinically stable results will be released to your [facility]. If you have not heard from [place] or cannot find your results in [facility] in 2 weeks please contact our office at [phone].  If you are not yet signed up for [facility], please consider signing up.        Preventive Care 40-64 Years, Female Preventive care refers to lifestyle choices and visits with your health care provider that can promote health and wellness. What does preventive care include?  A yearly physical exam. This is also called an annual well check.  Dental exams once or twice a year.  Routine eye exams. Ask your health care provider how often you should have your eyes checked.  Personal lifestyle choices, including: ? Daily care of your teeth and gums. ? Regular physical activity. ? Eating a healthy diet. ? Avoiding tobacco and drug use. ? Limiting alcohol use. ? Practicing safe sex. ? Taking low-dose aspirin daily starting at age 8. ? Taking vitamin and mineral supplements as recommended by your health care provider. What happens during an annual well check? The services and screenings done by your health care provider during your annual well check will depend on your age, overall health, lifestyle risk factors, and family history of disease. Counseling Your health care provider may ask you questions about your:  Alcohol use.  Tobacco use.  Drug use.  Emotional well-being.  Home and relationship well-being.  Sexual activity.  Eating habits.  Work and work Statistician.  Method of birth control.  Menstrual cycle.  Pregnancy history.  Screening You may have the following tests or measurements:  Height, weight, and BMI.  Blood pressure.  Lipid and  cholesterol levels. These may be checked every 5 years, or more frequently if you are over 28 years old.  Skin check.  Lung cancer screening. You may have this screening every year starting at age 26 if you have a 30-pack-year history of smoking and currently smoke or have quit within the past 15 years.  Fecal occult blood test (FOBT) of the stool. You may have this test every year starting at age 50.  Flexible sigmoidoscopy or colonoscopy. You may have a sigmoidoscopy every 5 years or a colonoscopy every 10 years starting at age 20.  Hepatitis C blood test.  Hepatitis B blood test.  Sexually transmitted disease (STD) testing.  Diabetes screening. This is done by checking your blood sugar (glucose) after you have not eaten for a while (fasting). You may have this done every 1-3 years.  Mammogram. This may be done every 1-2 years. Talk to your health care provider about when you should start having regular mammograms. This may depend on whether you have a family history of breast cancer.  BRCA-related cancer screening. This may be done if you have a family history of breast, ovarian, tubal, or peritoneal cancers.  Pelvic exam and Pap test. This may be done every 3 years starting at age 52. Starting at age 46, this may be done every 5 years if you have a Pap test in combination with an HPV test.  Bone density scan. This is done to screen for osteoporosis. You may have this scan if you are at high risk for osteoporosis.  Discuss your test results,  treatment options, and if necessary, the need for more tests with your health care provider. Vaccines Your health care provider may recommend certain vaccines, such as:  Influenza vaccine. This is recommended every year.  Tetanus, diphtheria, and acellular pertussis (Tdap, Td) vaccine. You may need a Td booster every 10 years.  Varicella vaccine. You may need this if you have not been vaccinated.  Zoster vaccine. You may need this after age  7.  Measles, mumps, and rubella (MMR) vaccine. You may need at least one dose of MMR if you were born in 1957 or later. You may also need a second dose.  Pneumococcal 13-valent conjugate (PCV13) vaccine. You may need this if you have certain conditions and were not previously vaccinated.  Pneumococcal polysaccharide (PPSV23) vaccine. You may need one or two doses if you smoke cigarettes or if you have certain conditions.  Meningococcal vaccine. You may need this if you have certain conditions.  Hepatitis A vaccine. You may need this if you have certain conditions or if you travel or work in places where you may be exposed to hepatitis A.  Hepatitis B vaccine. You may need this if you have certain conditions or if you travel or work in places where you may be exposed to hepatitis B.  Haemophilus influenzae type b (Hib) vaccine. You may need this if you have certain conditions.  Talk to your health care provider about which screenings and vaccines you need and how often you need them. This information is not intended to replace advice given to you by your health care provider. Make sure you discuss any questions you have with your health care provider. Document Released: 05/02/2015 Document Revised: 12/24/2015 Document Reviewed: 02/04/2015 Elsevier Interactive Patient Education  [employer].

## 2017-08-02 LAB — HEPATITIS C ANTIBODY
Hepatitis C Ab: NONREACTIVE
SIGNAL TO CUT-OFF: 0.01 (ref <1.00)

## 2017-08-02 NOTE — Telephone Encounter (Signed)
I left a detailed message with the information below at the pts cell number. 

## 2017-08-02 NOTE — Telephone Encounter (Signed)
Please let patient know and advise she call her gastroenterology clinic. Thanks.

## 2017-08-03 ENCOUNTER — Telehealth: Payer: Self-pay | Admitting: Family Medicine

## 2017-08-03 NOTE — Telephone Encounter (Signed)
Result note found.

## 2017-08-03 NOTE — Telephone Encounter (Signed)
Pt returned call and lab results given to her as requested. Recommendations also given to patient with verbal understanding.

## 2017-08-15 ENCOUNTER — Telehealth: Payer: Self-pay | Admitting: Family Medicine

## 2017-08-15 DIAGNOSIS — Z0184 Encounter for antibody response examination: Secondary | ICD-10-CM

## 2017-08-15 NOTE — Telephone Encounter (Signed)
Copied from [place] [phone]. Topic: General - Other >> Aug 15, 2017  2:44 PM Neva Seat wrote: Pt requesting: Measles vaccine asap 1st. Shingles vaccine

## 2017-08-15 NOTE — Telephone Encounter (Signed)
We don't usually vaccinate with measles vaccine in her age group. Does she need titers to prove immunity for a job or other reason? If not, she may want to contact the health department. We only have the MMR.

## 2017-08-16 NOTE — Telephone Encounter (Signed)
I left a detailed message with the information below and asked that the pt call back for a lab appt.  Orders entered.

## 2017-08-16 NOTE — Telephone Encounter (Signed)
Some individuals who were vaccinated between 1963-1967 may have gotten a weaker vaccine. Does she have a record or know if she got the live MMR? If she got the live vaccine, revaccination is not recommended. If she does not know or knows she got the killed vaccine then she can test for measles immunity or get one MMR vaccine.

## 2017-08-16 NOTE — Telephone Encounter (Signed)
I called the pt and she stated she only requested this due to recent news article about MMR.  Stated she believes she received the vaccine at age 57 and heard there was some discrepancies with the vaccine from that time.  Message sent to Dr Maudie Mercury.

## 2017-09-07 ENCOUNTER — Telehealth: Payer: Self-pay | Admitting: *Deleted

## 2017-09-07 NOTE — Telephone Encounter (Signed)
Patient requesting Shingrix vaccine. Okay to administer?

## 2017-09-08 ENCOUNTER — Ambulatory Visit: Payer: BLUE CROSS/BLUE SHIELD | Admitting: Podiatry

## 2017-09-08 ENCOUNTER — Encounter: Payer: Self-pay | Admitting: Podiatry

## 2017-09-08 ENCOUNTER — Ambulatory Visit (INDEPENDENT_AMBULATORY_CARE_PROVIDER_SITE_OTHER): Payer: BLUE CROSS/BLUE SHIELD

## 2017-09-08 DIAGNOSIS — M778 Other enthesopathies, not elsewhere classified: Secondary | ICD-10-CM

## 2017-09-08 DIAGNOSIS — M779 Enthesopathy, unspecified: Secondary | ICD-10-CM | POA: Diagnosis not present

## 2017-09-08 DIAGNOSIS — M7751 Other enthesopathy of right foot: Secondary | ICD-10-CM

## 2017-09-08 MED ORDER — MELOXICAM 15 MG PO TABS: 15.0000 mg | ORAL_TABLET | Freq: Every day | ORAL | 3 refills | Status: DC

## 2017-09-08 NOTE — Telephone Encounter (Signed)
ok 

## 2017-09-08 NOTE — Progress Notes (Signed)
Presents today for follow-up states that I started to develop pains right in here she points to the first metatarsophalangeal joint area of the right foot.  She is an isolated check to perform a hiking trip.  This is a surgical foot from July 30, 2016.  She denies any trauma.  Denies any clicking or popping.  Objective: Vital signs are stable alert and oriented x3 pulses are palpable.  Great range of motion of the first metatarsophalangeal joint of the right foot radiographs taken today do demonstrate joint space narrowing subchondral sclerosis and some eburnation.  This is consistent with osteoarthritic changes.  Assessment: Osteophytic changes capsulitis first metatarsophalangeal joint right foot.  Plan: Encouraged her to start on meloxicam 15 mg 1 p.o. daily and encouraged her to continue to keep moving until this wears out we did discuss the possible need for joint replacement she understands and is amenable to it if necessary.

## 2017-09-09 NOTE — Telephone Encounter (Signed)
Patient placed on wait list. She will be contacted to schedule when vaccine is available. 

## 2017-09-27 ENCOUNTER — Ambulatory Visit (INDEPENDENT_AMBULATORY_CARE_PROVIDER_SITE_OTHER): Payer: BLUE CROSS/BLUE SHIELD | Admitting: Family Medicine

## 2017-09-27 DIAGNOSIS — Z23 Encounter for immunization: Secondary | ICD-10-CM

## 2017-09-27 DIAGNOSIS — H524 Presbyopia: Secondary | ICD-10-CM | POA: Diagnosis not present

## 2017-11-29 ENCOUNTER — Ambulatory Visit (INDEPENDENT_AMBULATORY_CARE_PROVIDER_SITE_OTHER): Payer: BLUE CROSS/BLUE SHIELD | Admitting: Family Medicine

## 2017-11-29 DIAGNOSIS — Z23 Encounter for immunization: Secondary | ICD-10-CM

## 2017-12-21 DIAGNOSIS — Z8371 Family history of colonic polyps: Secondary | ICD-10-CM | POA: Diagnosis not present

## 2017-12-21 DIAGNOSIS — K640 First degree hemorrhoids: Secondary | ICD-10-CM | POA: Diagnosis not present

## 2017-12-21 DIAGNOSIS — K5730 Diverticulosis of large intestine without perforation or abscess without bleeding: Secondary | ICD-10-CM | POA: Diagnosis not present

## 2017-12-21 LAB — HM COLONOSCOPY

## 2017-12-27 ENCOUNTER — Encounter: Payer: Self-pay | Admitting: Family Medicine

## 2018-01-18 DIAGNOSIS — D2272 Melanocytic nevi of left lower limb, including hip: Secondary | ICD-10-CM | POA: Diagnosis not present

## 2018-01-18 DIAGNOSIS — D2339 Other benign neoplasm of skin of other parts of face: Secondary | ICD-10-CM | POA: Diagnosis not present

## 2018-01-18 DIAGNOSIS — Z23 Encounter for immunization: Secondary | ICD-10-CM | POA: Diagnosis not present

## 2018-01-18 DIAGNOSIS — D485 Neoplasm of uncertain behavior of skin: Secondary | ICD-10-CM | POA: Diagnosis not present

## 2018-01-18 DIAGNOSIS — L570 Actinic keratosis: Secondary | ICD-10-CM | POA: Diagnosis not present

## 2018-03-14 ENCOUNTER — Ambulatory Visit: Payer: BLUE CROSS/BLUE SHIELD | Admitting: Family Medicine

## 2018-03-14 ENCOUNTER — Encounter: Payer: Self-pay | Admitting: Family Medicine

## 2018-03-14 VITALS — BP 100/68 | HR 82 | Temp 98.2°F | Ht 66.0 in | Wt 147.0 lb

## 2018-03-14 DIAGNOSIS — J3489 Other specified disorders of nose and nasal sinuses: Secondary | ICD-10-CM | POA: Diagnosis not present

## 2018-03-14 DIAGNOSIS — L989 Disorder of the skin and subcutaneous tissue, unspecified: Secondary | ICD-10-CM | POA: Diagnosis not present

## 2018-03-14 NOTE — Progress Notes (Signed)
  HPI:  Using dictation device. Unfortunately this device frequently misinterprets words/phrases.  Acute visit for skin lesion: -hx BCC and sees dermatologist 2x per year -reports: for one week soreness on the L ant nose only when presses there -denies: swelling, redness, fever, malaise, nasal congestion more then usually, nose bleed  ROS: See pertinent positives and negatives per HPI.  Past Medical History:  Diagnosis Date  . Arthritis   . Basal cell carcinoma 08/13/2010   sees dermatologist  . Genital warts   . Hot flashes    and vaginal atrophy - sees Dr. Marvel Plan  . IBS (irritable bowel syndrome)   . Kidney stones   . Right ear pain   . Rotator cuff syndrome of left shoulder 08/13/2010    Past Surgical History:  Procedure Laterality Date  . ABDOMINAL HYSTERECTOMY    . APPENDECTOMY    . BASAL CELL CARCINOMA EXCISION    . WISDOM TOOTH EXTRACTION      Family History  Problem Relation Age of Onset  . Hypertension Mother        Living  . Arthritis Father   . Hyperlipidemia Father 39       Deceased  . Heart disease Father 33       MI  . Hyperlipidemia Sister   . Hypertension Sister   . Stroke Maternal Grandmother   . Alcohol abuse Maternal Grandfather   . Stroke Paternal Grandmother   . Heart disease Paternal Grandfather   . Hypertension Sister        x1  . Healthy Sister        x4  . Arthritis Sister        x1    SOCIAL HX: see hpi   Current Outpatient Medications:  .  Cholecalciferol (VITAMIN D-3) 1000 UNITS CAPS, Take by mouth daily., Disp: , Rfl:  .  estradiol (ESTRACE) 0.5 MG tablet, Take 0.5 mg by mouth once a week. , Disp: , Rfl: 11 .  Multiple Vitamin (MULTIVITAMIN) tablet, Take 1 tablet by mouth daily., Disp: , Rfl:  .  nystatin-triamcinolone ointment (MYCOLOG), APPLY TO AFFECTED AREA TWICE DAILY AS DIRECTED, Disp: , Rfl: 5  EXAM:  Vitals:   03/14/18 0754  BP: 100/68  Pulse: 82  Temp: 98.2 F (36.8 C)    Body mass index is 23.73  kg/m.  GENERAL: vitals reviewed and listed above, alert, oriented, appears well hydrated and in no acute distress  HEENT: atraumatic, conjunttiva clear, no obvious abnormalities on inspection of external nose and ears,  clear nasal congestion, mild post oropharyngeal erythema with PND, no tonsillar edema or exudate, no sinus TTP  SKIN: very small erythematous papule in area of concern  NECK: no obvious masses on inspection  MS: moves all extremities without noticeable abnormality  PSYCH: pleasant and cooperative, no obvious depression or anxiety  ASSESSMENT AND PLAN:  Discussed the following assessment and plan:  Nose pain  Skin lesion  -we discussed possible serious and likely etiologies, workup and treatment, treatment risks and return precautions - suspect very small pimple or ? small cracked nasal membrane from dry cold  -after this discussion, Jonnette opted for nasal saline, hydrocortisone cream and follow up with derm/ENT if persists in 1 week or sooner if worsening. -Patient advised to return or notify a doctor immediately if symptoms worsen or persist or new concerns arise.  There are no Patient Instructions on file for this visit.  Lucretia Kern, DO

## 2018-05-24 DIAGNOSIS — M9902 Segmental and somatic dysfunction of thoracic region: Secondary | ICD-10-CM | POA: Diagnosis not present

## 2018-05-24 DIAGNOSIS — M9905 Segmental and somatic dysfunction of pelvic region: Secondary | ICD-10-CM | POA: Diagnosis not present

## 2018-05-24 DIAGNOSIS — M9903 Segmental and somatic dysfunction of lumbar region: Secondary | ICD-10-CM | POA: Diagnosis not present

## 2018-05-24 DIAGNOSIS — M9904 Segmental and somatic dysfunction of sacral region: Secondary | ICD-10-CM | POA: Diagnosis not present

## 2018-06-28 DIAGNOSIS — Z6823 Body mass index (BMI) 23.0-23.9, adult: Secondary | ICD-10-CM | POA: Diagnosis not present

## 2018-06-28 DIAGNOSIS — Z1389 Encounter for screening for other disorder: Secondary | ICD-10-CM | POA: Diagnosis not present

## 2018-06-28 DIAGNOSIS — Z1231 Encounter for screening mammogram for malignant neoplasm of breast: Secondary | ICD-10-CM | POA: Diagnosis not present

## 2018-06-28 DIAGNOSIS — Z130 Encounter for screening for diseases of the blood and blood-forming organs and certain disorders involving the immune mechanism: Secondary | ICD-10-CM | POA: Diagnosis not present

## 2018-06-28 DIAGNOSIS — Z01419 Encounter for gynecological examination (general) (routine) without abnormal findings: Secondary | ICD-10-CM | POA: Diagnosis not present

## 2018-06-28 DIAGNOSIS — R319 Hematuria, unspecified: Secondary | ICD-10-CM | POA: Diagnosis not present

## 2018-07-03 DIAGNOSIS — F4322 Adjustment disorder with anxiety: Secondary | ICD-10-CM | POA: Diagnosis not present

## 2018-07-10 DIAGNOSIS — R319 Hematuria, unspecified: Secondary | ICD-10-CM | POA: Diagnosis not present

## 2018-07-14 DIAGNOSIS — R3121 Asymptomatic microscopic hematuria: Secondary | ICD-10-CM | POA: Diagnosis not present

## 2018-07-14 DIAGNOSIS — R350 Frequency of micturition: Secondary | ICD-10-CM | POA: Diagnosis not present

## 2018-07-19 DIAGNOSIS — R3129 Other microscopic hematuria: Secondary | ICD-10-CM | POA: Diagnosis not present

## 2018-07-19 DIAGNOSIS — R3121 Asymptomatic microscopic hematuria: Secondary | ICD-10-CM | POA: Diagnosis not present

## 2018-07-21 DIAGNOSIS — R3121 Asymptomatic microscopic hematuria: Secondary | ICD-10-CM | POA: Diagnosis not present

## 2018-07-21 DIAGNOSIS — R350 Frequency of micturition: Secondary | ICD-10-CM | POA: Diagnosis not present

## 2018-07-24 DIAGNOSIS — N029 Recurrent and persistent hematuria with unspecified morphologic changes: Secondary | ICD-10-CM | POA: Insufficient documentation

## 2018-08-24 DIAGNOSIS — R300 Dysuria: Secondary | ICD-10-CM | POA: Diagnosis not present

## 2018-08-24 DIAGNOSIS — N200 Calculus of kidney: Secondary | ICD-10-CM | POA: Diagnosis not present

## 2018-08-24 DIAGNOSIS — B9620 Unspecified Escherichia coli [E. coli] as the cause of diseases classified elsewhere: Secondary | ICD-10-CM | POA: Diagnosis not present

## 2018-08-24 DIAGNOSIS — N390 Urinary tract infection, site not specified: Secondary | ICD-10-CM | POA: Diagnosis not present

## 2018-09-08 ENCOUNTER — Other Ambulatory Visit: Payer: Self-pay

## 2018-09-08 ENCOUNTER — Encounter: Payer: Self-pay | Admitting: Family Medicine

## 2018-09-08 ENCOUNTER — Ambulatory Visit (INDEPENDENT_AMBULATORY_CARE_PROVIDER_SITE_OTHER): Payer: BLUE CROSS/BLUE SHIELD | Admitting: Family Medicine

## 2018-09-08 DIAGNOSIS — W57XXXA Bitten or stung by nonvenomous insect and other nonvenomous arthropods, initial encounter: Secondary | ICD-10-CM | POA: Diagnosis not present

## 2018-09-08 DIAGNOSIS — S30860A Insect bite (nonvenomous) of lower back and pelvis, initial encounter: Secondary | ICD-10-CM

## 2018-09-08 MED ORDER — DOXYCYCLINE HYCLATE 50 MG PO CAPS: 50.0000 mg | ORAL_CAPSULE | Freq: Two times a day (BID) | ORAL | 0 refills | Status: AC

## 2018-09-08 NOTE — Progress Notes (Signed)
Virtual Visit via Video Note  I connected with Jordan Khan on 09/08/18 at  8:30 AM EDT by a video enabled telemedicine application and verified that I am speaking with the correct person using two identifiers.  Location patient: home Location provider:work or home office Persons participating in the virtual visit: patient, provider  I discussed the limitations of evaluation and management by telemedicine and the availability of in person appointments. The patient expressed understanding and agreed to proceed.   Jordan Khan DOB: 12-02-1960 Encounter date: 09/08/2018  This is a 58 y.o. female who presents with Chief Complaint  Patient presents with  . Insect Bite    patient complains of questionable mole on her back x2 weeks ago, noticed tick after scratching the area    History of present illness:  HPI  A week ago Sunday felt something along bra strap; husband thought that it was mole when he felt it. 3 days later feels something lower. Slight resistance but then tick comes off. Crawls around and then she washed down sink.   Spot where it was originally found has nothing there. But has been checking skin daily. Then this morning is noted a small "pimple" spot where it was located on lower back.   There are two small erythematous patches around a small central papule. Not itchy, but there is psychological component.   No fevers, chills, aches/pains. No other rashes.   Has had UTI, kidney stones. Completed this on Monday.  No Known Allergies Current Meds  Medication Sig  . estradiol (ESTRACE) 0.5 MG tablet Take 0.5 mg by mouth once a week.   . nystatin-triamcinolone ointment (MYCOLOG) APPLY TO AFFECTED AREA TWICE DAILY AS DIRECTED  . [DISCONTINUED] Cholecalciferol (VITAMIN D-3) 1000 UNITS CAPS Take by mouth daily.  . [DISCONTINUED] Multiple Vitamin (MULTIVITAMIN) tablet Take 1 tablet by mouth daily.    Review of Systems  Constitutional: Negative for chills, fatigue  and fever.  Respiratory: Negative for cough, chest tightness, shortness of breath and wheezing.   Cardiovascular: Negative for chest pain, palpitations and leg swelling.  Skin: Positive for wound. Negative for rash.    Objective:  There were no vitals taken for this visit.      BP Readings from Last 3 Encounters:  03/14/18 100/68  08/01/17 100/68  06/10/16 119/74   Wt Readings from Last 3 Encounters:  03/14/18 147 lb (66.7 kg)  08/01/17 146 lb 9.6 oz (66.5 kg)  09/02/14 148 lb 12.8 oz (67.5 kg)    EXAM:  GENERAL: alert, oriented, appears well and in no acute distress  HEENT: atraumatic, conjunctiva clear, no obvious abnormalities on inspection of external nose and ears  NECK: normal movements of the head and neck  LUNGS: on inspection no signs of respiratory distress, breathing rate appears normal, no obvious gross SOB, gasping or wheezing  CV: no obvious cyanosis  MS: moves all visible extremities without noticeable abnormality  PSYCH/NEURO: pleasant and cooperative, no obvious depression or anxiety, speech and thought processing grossly intact  SKIN: There are 2 erythematous papules - one upper mid back, one lower. Both have small (less than 1cm) surrounding erythema - very light.  Assessment/Plan  1. Tick bite, initial encounter Tick on for over 48 hours; now with red papule/no other rash. Discussed options. Doxy sent if no improvement in next 48 hours or if any worsening. Call if sx worsen while on abx.     I discussed the assessment and treatment plan with the patient. The patient was provided an  opportunity to ask questions and all were answered. The patient agreed with the plan and demonstrated an understanding of the instructions.   The patient was advised to call back or seek an in-person evaluation if the symptoms worsen or if the condition fails to improve as anticipated.  I provided 20 minutes of non-face-to-face time during this encounter.   Micheline Rough, MD

## 2018-09-08 NOTE — Patient Instructions (Signed)

## 2018-09-12 ENCOUNTER — Telehealth: Payer: Self-pay | Admitting: *Deleted

## 2018-09-12 NOTE — Telephone Encounter (Signed)
I left a detailed message at the pts cell number with the information below and asked that she call back for an appt.

## 2018-09-12 NOTE — Telephone Encounter (Signed)
-----   Message from Caren Macadam, MD sent at 09/08/2018  9:04 AM EDT ----- Please set up physical for fall (I can do TOC as well as physical at that time/same appointment) (no rush). Also let her know I am going to put some tick information on her patient instructions/AVS from today.

## 2018-09-26 DIAGNOSIS — L820 Inflamed seborrheic keratosis: Secondary | ICD-10-CM | POA: Diagnosis not present

## 2018-09-26 DIAGNOSIS — L570 Actinic keratosis: Secondary | ICD-10-CM | POA: Diagnosis not present

## 2018-09-26 DIAGNOSIS — S30860A Insect bite (nonvenomous) of lower back and pelvis, initial encounter: Secondary | ICD-10-CM | POA: Diagnosis not present

## 2018-09-26 DIAGNOSIS — Z86018 Personal history of other benign neoplasm: Secondary | ICD-10-CM | POA: Diagnosis not present

## 2018-10-24 DIAGNOSIS — R350 Frequency of micturition: Secondary | ICD-10-CM | POA: Diagnosis not present

## 2018-10-24 DIAGNOSIS — R8271 Bacteriuria: Secondary | ICD-10-CM | POA: Diagnosis not present

## 2018-10-24 DIAGNOSIS — R300 Dysuria: Secondary | ICD-10-CM | POA: Diagnosis not present

## 2018-10-26 DIAGNOSIS — N200 Calculus of kidney: Secondary | ICD-10-CM | POA: Diagnosis not present

## 2018-10-26 DIAGNOSIS — R350 Frequency of micturition: Secondary | ICD-10-CM | POA: Diagnosis not present

## 2018-10-26 DIAGNOSIS — R3121 Asymptomatic microscopic hematuria: Secondary | ICD-10-CM | POA: Diagnosis not present

## 2019-01-31 ENCOUNTER — Other Ambulatory Visit: Payer: Self-pay

## 2019-01-31 ENCOUNTER — Ambulatory Visit (INDEPENDENT_AMBULATORY_CARE_PROVIDER_SITE_OTHER): Payer: BC Managed Care – PPO | Admitting: Family Medicine

## 2019-01-31 ENCOUNTER — Encounter: Payer: Self-pay | Admitting: Family Medicine

## 2019-01-31 VITALS — BP 100/60 | HR 59 | Temp 98.1°F | Ht 66.0 in | Wt 146.4 lb

## 2019-01-31 DIAGNOSIS — E785 Hyperlipidemia, unspecified: Secondary | ICD-10-CM | POA: Diagnosis not present

## 2019-01-31 DIAGNOSIS — Z85828 Personal history of other malignant neoplasm of skin: Secondary | ICD-10-CM

## 2019-01-31 DIAGNOSIS — Z23 Encounter for immunization: Secondary | ICD-10-CM | POA: Diagnosis not present

## 2019-01-31 DIAGNOSIS — D485 Neoplasm of uncertain behavior of skin: Secondary | ICD-10-CM | POA: Diagnosis not present

## 2019-01-31 DIAGNOSIS — D72829 Elevated white blood cell count, unspecified: Secondary | ICD-10-CM | POA: Diagnosis not present

## 2019-01-31 DIAGNOSIS — R739 Hyperglycemia, unspecified: Secondary | ICD-10-CM

## 2019-01-31 DIAGNOSIS — Z0000 Encounter for general adult medical examination without abnormal findings: Secondary | ICD-10-CM

## 2019-01-31 DIAGNOSIS — D225 Melanocytic nevi of trunk: Secondary | ICD-10-CM | POA: Diagnosis not present

## 2019-01-31 DIAGNOSIS — L570 Actinic keratosis: Secondary | ICD-10-CM | POA: Diagnosis not present

## 2019-01-31 DIAGNOSIS — L820 Inflamed seborrheic keratosis: Secondary | ICD-10-CM | POA: Diagnosis not present

## 2019-01-31 DIAGNOSIS — Z86018 Personal history of other benign neoplasm: Secondary | ICD-10-CM | POA: Diagnosis not present

## 2019-01-31 DIAGNOSIS — L821 Other seborrheic keratosis: Secondary | ICD-10-CM | POA: Diagnosis not present

## 2019-01-31 LAB — CBC WITH DIFFERENTIAL/PLATELET
Basophils Absolute: 0 10*3/uL (ref 0.0–0.1)
Basophils Relative: 0.6 % (ref 0.0–3.0)
Eosinophils Absolute: 0.1 10*3/uL (ref 0.0–0.7)
Eosinophils Relative: 2.2 % (ref 0.0–5.0)
HCT: 38.5 % (ref 36.0–46.0)
Hemoglobin: 13 g/dL (ref 12.0–15.0)
Lymphocytes Relative: 28.9 % (ref 12.0–46.0)
Lymphs Abs: 1.6 10*3/uL (ref 0.7–4.0)
MCHC: 33.8 g/dL (ref 30.0–36.0)
MCV: 95.7 fl (ref 78.0–100.0)
Monocytes Absolute: 0.4 10*3/uL (ref 0.1–1.0)
Monocytes Relative: 7.8 % (ref 3.0–12.0)
Neutro Abs: 3.4 10*3/uL (ref 1.4–7.7)
Neutrophils Relative %: 60.5 % (ref 43.0–77.0)
Platelets: 300 10*3/uL (ref 150.0–400.0)
RBC: 4.02 Mil/uL (ref 3.87–5.11)
RDW: 13.1 % (ref 11.5–15.5)
WBC: 5.6 10*3/uL (ref 4.0–10.5)

## 2019-01-31 LAB — LIPID PANEL
Cholesterol: 251 mg/dL — ABNORMAL HIGH (ref 0–200)
HDL: 85.2 mg/dL (ref >39.00)
LDL Cholesterol: 135 mg/dL — ABNORMAL HIGH (ref 0–99)
NonHDL: 165.88
Total CHOL/HDL Ratio: 3
Triglycerides: 155 mg/dL — ABNORMAL HIGH (ref 0.0–149.0)
VLDL: 31 mg/dL (ref 0.0–40.0)

## 2019-01-31 LAB — COMPREHENSIVE METABOLIC PANEL
ALT: 18 U/L (ref 0–35)
AST: 21 U/L (ref 0–37)
Albumin: 4.2 g/dL (ref 3.5–5.2)
Alkaline Phosphatase: 54 U/L (ref 39–117)
BUN: 16 mg/dL (ref 6–23)
CO2: 28 mEq/L (ref 19–32)
Calcium: 9.3 mg/dL (ref 8.4–10.5)
Chloride: 103 mEq/L (ref 96–112)
Creatinine, Ser: 0.65 mg/dL (ref 0.40–1.20)
GFR: 93.15 mL/min (ref >60.00)
Glucose, Bld: 91 mg/dL (ref 70–99)
Potassium: 4.1 mEq/L (ref 3.5–5.1)
Sodium: 138 mEq/L (ref 135–145)
Total Bilirubin: 0.4 mg/dL (ref 0.2–1.2)
Total Protein: 6.3 g/dL (ref 6.0–8.3)

## 2019-01-31 LAB — HEMOGLOBIN A1C: Hgb A1c MFr Bld: 5.5 % (ref 4.6–6.5)

## 2019-01-31 LAB — TSH: TSH: 1.35 u[IU]/mL (ref 0.35–4.50)

## 2019-01-31 NOTE — Progress Notes (Signed)
Jordan Khan DOB: 1960-11-17 Encounter date: 01/31/2019  This is a 58 y.o. female who presents for complete physical   History of present illness/Additional concerns: No specific concerns today.   Kidney stone hx: flared past spring. Had several years prior. Gets more nausea when she has them. Usually small stones. Followed with [facility]; had evaluation for this. Cystoscopy, CT scan. We do not have these records.   Follows with Paula Compton for gyn care.  Sees dermatology q 46month for skin exam.  Last colonoscopy: 12/2017; suggested repeat in 10 years.   Does yoga, biking, walking, pickle ball.   Past Medical History:  Diagnosis Date  . Arthritis   . Basal cell carcinoma 08/13/2010   sees dermatologist  . Genital warts   . Hot flashes    and vaginal atrophy - sees Dr. RMarvel Plan . IBS (irritable bowel syndrome)   . Kidney stones   . Right ear pain   . Rotator cuff syndrome of left shoulder 08/13/2010   Past Surgical History:  Procedure Laterality Date  . ABDOMINAL HYSTERECTOMY  2002   done for endometriosis. one ovary remains  . APPENDECTOMY  2002   removed at time of hysterctomy  . BASAL CELL CARCINOMA EXCISION  2010  . FOOT SURGERY  2018   right foot  . WISDOM TOOTH EXTRACTION     No Known Allergies Current Meds  Medication Sig  . estradiol (ESTRACE) 0.1 MG/GM vaginal cream estradiol 0.01% (0.1 mg/gram) vaginal cream  INSERT 1 GRAM VAGINALLY AT BEDTIME FOR 14 DAYS, THEN USE 1/2 GRAM 2 TO 3 TIMES A WEEK  . estradiol (ESTRACE) 0.5 MG tablet Take 0.5 mg by mouth once a week.    Social History   Tobacco Use  . Smoking status: Never Smoker  . Smokeless tobacco: Never Used  Substance Use Topics  . Alcohol use: Yes    Comment: 1-2 glasses of wine once per week   Family History  Problem Relation Age of Onset  . Hypertension Mother   . Dementia Mother 961 . Stroke Mother 91 . Arthritis Father   . Hyperlipidemia Father 974      Deceased  .  Heart disease Father 729      MI  . Dementia Father   . Stroke Maternal Grandmother 80  . Alcohol abuse Maternal Grandfather   . Stroke Paternal Grandmother 839 . Heart disease Paternal Grandfather   . Hyperlipidemia Sister   . Hypertension Sister   . Diverticulitis Sister   . Dementia Sister 653      early onset  . Asthma Sister   . Diverticulitis Sister   . Arthritis Sister        x1  . Diverticulitis Sister   . Arthritis Sister        neck surgery     Review of Systems  Constitutional: Negative for activity change, appetite change, chills, fatigue, fever and unexpected weight change.  HENT: Negative for congestion, ear pain, hearing loss, sinus pressure, sinus pain, sore throat and trouble swallowing.   Eyes: Negative for pain and visual disturbance.  Respiratory: Negative for cough, chest tightness, shortness of breath and wheezing.   Cardiovascular: Negative for chest pain, palpitations and leg swelling.  Gastrointestinal: Negative for abdominal pain, blood in stool, constipation, diarrhea, nausea and vomiting.  Genitourinary: Negative for difficulty urinating and menstrual problem.  Musculoskeletal: Negative for arthralgias and back pain.  Skin: Negative for rash.  Neurological: Negative for dizziness, weakness,  numbness and headaches.  Hematological: Negative for adenopathy. Does not bruise/bleed easily.  Psychiatric/Behavioral: Negative for sleep disturbance and suicidal ideas. The patient is not nervous/anxious.     CBC:  Lab Results  Component Value Date   WBC 11.0 (H) 09/12/2010   HGB 15.2 (H) 09/12/2010   HCT 43.3 09/12/2010   MCH 32.2 09/12/2010   MCHC 35.1 09/12/2010   RDW 12.6 09/12/2010   PLT 366 09/12/2010   CMP: Lab Results  Component Value Date   NA 136 09/12/2010   K 3.6 09/12/2010   CL 99 09/12/2010   CO2 24 09/12/2010   GLUCOSE 125 (H) 09/12/2010   BUN 11 09/12/2010   CREATININE 0.50 09/12/2010   GFRAA  09/12/2010    >60        The  eGFR has been calculated using the MDRD equation. This calculation has not been validated in all clinical situations. eGFR's persistently <60 mL/min signify possible Chronic Kidney Disease.   CALCIUM 9.9 09/12/2010   PROT 7.6 09/12/2010   BILITOT 0.5 09/12/2010   ALKPHOS 48 09/12/2010   ALT 22 09/12/2010   AST 19 09/12/2010   LIPID: Lab Results  Component Value Date   CHOL 221 (H) 08/01/2017   TRIG 124.0 08/01/2017   HDL 70.60 08/01/2017   LDLCALC 125 (H) 08/01/2017    Objective:  BP 100/60 (BP Location: Left Arm, Patient Position: Sitting, Cuff Size: Normal)   Pulse (!) 59   Temp 98.1 F (36.7 C) (Temporal)   Ht '5\' 6"'  (1.676 m)   Wt 146 lb 6.4 oz (66.4 kg)   SpO2 97%   BMI 23.63 kg/m   Weight: 146 lb 6.4 oz (66.4 kg)   BP Readings from Last 3 Encounters:  01/31/19 100/60  03/14/18 100/68  08/01/17 100/68   Wt Readings from Last 3 Encounters:  01/31/19 146 lb 6.4 oz (66.4 kg)  03/14/18 147 lb (66.7 kg)  08/01/17 146 lb 9.6 oz (66.5 kg)    Physical Exam Constitutional:      General: She is not in acute distress.    Appearance: She is well-developed.  HENT:     Head: Normocephalic and atraumatic.     Right Ear: External ear normal.     Left Ear: External ear normal.     Mouth/Throat:     Pharynx: No oropharyngeal exudate.  Eyes:     Conjunctiva/sclera: Conjunctivae normal.     Pupils: Pupils are equal, round, and reactive to light.  Neck:     Musculoskeletal: Normal range of motion and neck supple.     Thyroid: No thyromegaly.  Cardiovascular:     Rate and Rhythm: Normal rate and regular rhythm.     Heart sounds: Normal heart sounds. No murmur. No friction rub. No gallop.   Pulmonary:     Effort: Pulmonary effort is normal.     Breath sounds: Normal breath sounds.  Abdominal:     General: Bowel sounds are normal. There is no distension.     Palpations: Abdomen is soft. There is no mass.     Tenderness: There is no abdominal tenderness. There is no  guarding.     Hernia: No hernia is present.  Musculoskeletal: Normal range of motion.        General: No tenderness or deformity.  Lymphadenopathy:     Cervical: No cervical adenopathy.  Skin:    General: Skin is warm and dry.     Findings: No rash.  Neurological:  Mental Status: She is alert and oriented to person, place, and time.     Deep Tendon Reflexes: Reflexes normal.     Reflex Scores:      Tricep reflexes are 2+ on the right side and 2+ on the left side.      Bicep reflexes are 2+ on the right side and 2+ on the left side.      Brachioradialis reflexes are 2+ on the right side and 2+ on the left side.      Patellar reflexes are 2+ on the right side and 2+ on the left side. Psychiatric:        Speech: Speech normal.        Behavior: Behavior normal.        Thought Content: Thought content normal.     Assessment/Plan: Health Maintenance Due  Topic Date Due  . INFLUENZA VACCINE  11/18/2018   Health Maintenance reviewed.  1. Preventative health care Keep up with healthy lifestyle. Up to date with preventative care. Flu shot today.  2. Hyperlipidemia, unspecified hyperlipidemia type - Comprehensive metabolic panel; Future - Lipid panel; Future - TSH; Future  3. Leukocytosis, unspecified type - CBC with Differential/Platelet; Future  4. Hyperglycemia - Hemoglobin A1c; Future  5. Hx of basal cell: following with dermatology.  Return in about 1 year (around 01/31/2020) for physical exam.  Micheline Rough, MD

## 2019-01-31 NOTE — Addendum Note (Signed)
Addended by: Lahoma Crocker A on: 01/31/2019 12:00 PM   Modules accepted: Orders

## 2019-01-31 NOTE — Addendum Note (Signed)
Addended by: Suzette Battiest on: 01/31/2019 11:58 AM   Modules accepted: Orders

## 2019-05-04 ENCOUNTER — Other Ambulatory Visit: Payer: BC Managed Care – PPO

## 2019-05-22 DIAGNOSIS — M7501 Adhesive capsulitis of right shoulder: Secondary | ICD-10-CM | POA: Insufficient documentation

## 2019-05-22 DIAGNOSIS — M25511 Pain in right shoulder: Secondary | ICD-10-CM | POA: Insufficient documentation

## 2019-06-04 DIAGNOSIS — M25511 Pain in right shoulder: Secondary | ICD-10-CM | POA: Diagnosis not present

## 2019-06-04 DIAGNOSIS — M7501 Adhesive capsulitis of right shoulder: Secondary | ICD-10-CM | POA: Diagnosis not present

## 2019-06-12 DIAGNOSIS — M25511 Pain in right shoulder: Secondary | ICD-10-CM | POA: Diagnosis not present

## 2019-06-12 DIAGNOSIS — M7501 Adhesive capsulitis of right shoulder: Secondary | ICD-10-CM | POA: Diagnosis not present

## 2019-06-14 DIAGNOSIS — M25511 Pain in right shoulder: Secondary | ICD-10-CM | POA: Diagnosis not present

## 2019-06-14 DIAGNOSIS — M7501 Adhesive capsulitis of right shoulder: Secondary | ICD-10-CM | POA: Diagnosis not present

## 2019-06-19 DIAGNOSIS — M25511 Pain in right shoulder: Secondary | ICD-10-CM | POA: Diagnosis not present

## 2019-06-19 DIAGNOSIS — M7501 Adhesive capsulitis of right shoulder: Secondary | ICD-10-CM | POA: Diagnosis not present

## 2019-06-21 DIAGNOSIS — M7501 Adhesive capsulitis of right shoulder: Secondary | ICD-10-CM | POA: Diagnosis not present

## 2019-06-21 DIAGNOSIS — M25511 Pain in right shoulder: Secondary | ICD-10-CM | POA: Diagnosis not present

## 2019-06-26 DIAGNOSIS — M25511 Pain in right shoulder: Secondary | ICD-10-CM | POA: Diagnosis not present

## 2019-06-26 DIAGNOSIS — M7501 Adhesive capsulitis of right shoulder: Secondary | ICD-10-CM | POA: Diagnosis not present

## 2019-06-28 DIAGNOSIS — M7501 Adhesive capsulitis of right shoulder: Secondary | ICD-10-CM | POA: Diagnosis not present

## 2019-06-28 DIAGNOSIS — M25511 Pain in right shoulder: Secondary | ICD-10-CM | POA: Diagnosis not present

## 2019-07-03 DIAGNOSIS — M25511 Pain in right shoulder: Secondary | ICD-10-CM | POA: Diagnosis not present

## 2019-07-03 DIAGNOSIS — M7501 Adhesive capsulitis of right shoulder: Secondary | ICD-10-CM | POA: Diagnosis not present

## 2019-07-11 DIAGNOSIS — Z1389 Encounter for screening for other disorder: Secondary | ICD-10-CM | POA: Diagnosis not present

## 2019-07-11 DIAGNOSIS — Z6823 Body mass index (BMI) 23.0-23.9, adult: Secondary | ICD-10-CM | POA: Diagnosis not present

## 2019-07-11 DIAGNOSIS — Z01419 Encounter for gynecological examination (general) (routine) without abnormal findings: Secondary | ICD-10-CM | POA: Diagnosis not present

## 2019-07-11 DIAGNOSIS — N959 Unspecified menopausal and perimenopausal disorder: Secondary | ICD-10-CM | POA: Diagnosis not present

## 2019-07-19 DIAGNOSIS — M7501 Adhesive capsulitis of right shoulder: Secondary | ICD-10-CM | POA: Diagnosis not present

## 2019-07-19 DIAGNOSIS — M25511 Pain in right shoulder: Secondary | ICD-10-CM | POA: Diagnosis not present

## 2019-08-01 DIAGNOSIS — Z86018 Personal history of other benign neoplasm: Secondary | ICD-10-CM | POA: Diagnosis not present

## 2019-08-01 DIAGNOSIS — D225 Melanocytic nevi of trunk: Secondary | ICD-10-CM | POA: Diagnosis not present

## 2019-08-01 DIAGNOSIS — L570 Actinic keratosis: Secondary | ICD-10-CM | POA: Diagnosis not present

## 2019-08-01 DIAGNOSIS — L821 Other seborrheic keratosis: Secondary | ICD-10-CM | POA: Diagnosis not present

## 2019-08-01 DIAGNOSIS — Z85828 Personal history of other malignant neoplasm of skin: Secondary | ICD-10-CM | POA: Diagnosis not present

## 2019-08-20 DIAGNOSIS — Z1231 Encounter for screening mammogram for malignant neoplasm of breast: Secondary | ICD-10-CM | POA: Diagnosis not present

## 2019-10-09 DIAGNOSIS — L570 Actinic keratosis: Secondary | ICD-10-CM | POA: Diagnosis not present

## 2019-10-09 DIAGNOSIS — D2239 Melanocytic nevi of other parts of face: Secondary | ICD-10-CM | POA: Diagnosis not present

## 2019-10-09 DIAGNOSIS — D485 Neoplasm of uncertain behavior of skin: Secondary | ICD-10-CM | POA: Diagnosis not present

## 2019-11-05 DIAGNOSIS — Z20822 Contact with and (suspected) exposure to covid-19: Secondary | ICD-10-CM | POA: Diagnosis not present

## 2019-11-05 DIAGNOSIS — Z03818 Encounter for observation for suspected exposure to other biological agents ruled out: Secondary | ICD-10-CM | POA: Diagnosis not present

## 2019-11-07 ENCOUNTER — Telehealth: Payer: Self-pay | Admitting: Family Medicine

## 2019-11-07 ENCOUNTER — Telehealth: Payer: Self-pay | Admitting: *Deleted

## 2019-11-07 NOTE — Telephone Encounter (Signed)
Patient called after hours line 11/05/2019. Patient reports she developed mild sore throat, cold, cough, muscle aches and headache symptoms three days ago. No known fever. She tested positive for COVID at [facility] about 1 pm. No severe breathing difficulty or chest pain. Alert and responsive. Patient was vaccinated for COVID 19. Nurse triaged advised patient to follow up with PCP.

## 2019-11-07 NOTE — Telephone Encounter (Signed)
Patient called back and stated she is feeling the same, sneezing, loss of sense of smell, fatigue and states there is nothing serious.    Message sent to PCP.

## 2019-11-07 NOTE — Telephone Encounter (Signed)
Patient informed of the message below.

## 2019-11-07 NOTE — Telephone Encounter (Signed)
Left a detailed message stating I was calling to see how she was doing today and asked that she return a call to the office.

## 2019-11-07 NOTE — Telephone Encounter (Signed)
Pt returned your call and want a call back.

## 2019-11-07 NOTE — Telephone Encounter (Signed)
Left a detailed message at the pts cell number to contact the office to let us know how she is feeling.

## 2019-11-07 NOTE — Telephone Encounter (Signed)
Ok; just let us know if any concern with worsening of symptoms. Thanks!

## 2019-11-07 NOTE — Telephone Encounter (Signed)
Please check in with her. I was out of office until today; so just see how she is doing; see if any concerns and we can go from there.

## 2020-02-20 NOTE — Telephone Encounter (Signed)
error 

## 2020-02-27 DIAGNOSIS — L570 Actinic keratosis: Secondary | ICD-10-CM | POA: Diagnosis not present

## 2020-02-27 DIAGNOSIS — L821 Other seborrheic keratosis: Secondary | ICD-10-CM | POA: Diagnosis not present

## 2020-02-27 DIAGNOSIS — D225 Melanocytic nevi of trunk: Secondary | ICD-10-CM | POA: Diagnosis not present

## 2020-02-27 DIAGNOSIS — L578 Other skin changes due to chronic exposure to nonionizing radiation: Secondary | ICD-10-CM | POA: Diagnosis not present

## 2020-09-03 DIAGNOSIS — L570 Actinic keratosis: Secondary | ICD-10-CM | POA: Diagnosis not present

## 2020-09-03 DIAGNOSIS — L578 Other skin changes due to chronic exposure to nonionizing radiation: Secondary | ICD-10-CM | POA: Diagnosis not present

## 2020-09-03 DIAGNOSIS — L821 Other seborrheic keratosis: Secondary | ICD-10-CM | POA: Diagnosis not present

## 2020-09-03 DIAGNOSIS — D225 Melanocytic nevi of trunk: Secondary | ICD-10-CM | POA: Diagnosis not present

## 2020-10-02 DIAGNOSIS — Z1231 Encounter for screening mammogram for malignant neoplasm of breast: Secondary | ICD-10-CM | POA: Diagnosis not present

## 2020-10-02 DIAGNOSIS — Z130 Encounter for screening for diseases of the blood and blood-forming organs and certain disorders involving the immune mechanism: Secondary | ICD-10-CM | POA: Diagnosis not present

## 2020-10-02 DIAGNOSIS — Z01419 Encounter for gynecological examination (general) (routine) without abnormal findings: Secondary | ICD-10-CM | POA: Diagnosis not present

## 2020-10-02 DIAGNOSIS — Z6824 Body mass index (BMI) 24.0-24.9, adult: Secondary | ICD-10-CM | POA: Diagnosis not present

## 2020-10-02 DIAGNOSIS — Z1389 Encounter for screening for other disorder: Secondary | ICD-10-CM | POA: Diagnosis not present

## 2020-12-12 ENCOUNTER — Encounter: Payer: Self-pay | Admitting: Family Medicine

## 2020-12-12 ENCOUNTER — Other Ambulatory Visit: Payer: Self-pay

## 2020-12-12 ENCOUNTER — Ambulatory Visit (INDEPENDENT_AMBULATORY_CARE_PROVIDER_SITE_OTHER): Payer: BC Managed Care – PPO | Admitting: Family Medicine

## 2020-12-12 VITALS — BP 102/80 | HR 62 | Temp 97.9°F | Ht 65.75 in | Wt 148.2 lb

## 2020-12-12 DIAGNOSIS — Z131 Encounter for screening for diabetes mellitus: Secondary | ICD-10-CM

## 2020-12-12 DIAGNOSIS — M79645 Pain in left finger(s): Secondary | ICD-10-CM

## 2020-12-12 DIAGNOSIS — Z13220 Encounter for screening for lipoid disorders: Secondary | ICD-10-CM

## 2020-12-12 DIAGNOSIS — Z0001 Encounter for general adult medical examination with abnormal findings: Secondary | ICD-10-CM | POA: Diagnosis not present

## 2020-12-12 DIAGNOSIS — Z0000 Encounter for general adult medical examination without abnormal findings: Secondary | ICD-10-CM

## 2020-12-12 DIAGNOSIS — Z23 Encounter for immunization: Secondary | ICD-10-CM

## 2020-12-12 NOTE — Progress Notes (Signed)
Jordan Khan DOB: January 20, 1961 Encounter date: 12/12/2020  This is a 60 y.o. female who presents for complete physical   History of present illness/Additional concerns:  Staying active - still doing yoga, biking, walking, pickle ball.   Colonoscopy due 12/2027 Follows with derm, Dr. Delman Cheadle Follows with Dr. Marvel Plan - obgyn  Past Medical History:  Diagnosis Date   Arthritis    Basal cell carcinoma 08/13/2010   sees dermatologist   Genital warts    Hot flashes    and vaginal atrophy - sees Dr. Marvel Plan   IBS (irritable bowel syndrome)    Kidney stones    Right ear pain    Rotator cuff syndrome of left shoulder 08/13/2010   Past Surgical History:  Procedure Laterality Date   ABDOMINAL HYSTERECTOMY  2002   done for endometriosis. one ovary remains   APPENDECTOMY  2002   removed at time of hysterctomy   BASAL CELL CARCINOMA EXCISION  2010   FOOT SURGERY  2018   right foot   WISDOM TOOTH EXTRACTION     No Known Allergies Current Meds  Medication Sig   estradiol (ESTRACE) 0.1 MG/GM vaginal cream estradiol 0.01% (0.1 mg/gram) vaginal cream  INSERT 1 GRAM VAGINALLY AT BEDTIME FOR 14 DAYS, THEN USE 1/2 GRAM 2 TO 3 TIMES A WEEK   estradiol (ESTRACE) 0.5 MG tablet Take 0.5 mg by mouth once a week.    Social History   Tobacco Use   Smoking status: Never   Smokeless tobacco: Never  Substance Use Topics   Alcohol use: Yes    Comment: 1-2 glasses of wine once per week   Family History  Problem Relation Age of Onset   Hypertension Mother    Dementia Mother 56   Stroke Mother 3   Arthritis Father    Hyperlipidemia Father 7       Deceased   Heart disease Father 2       MI   Dementia Father    Stroke Maternal Grandmother 24   Alcohol abuse Maternal Grandfather    Stroke Paternal Grandmother 66   Heart disease Paternal Grandfather    Hyperlipidemia Sister    Hypertension Sister    Diverticulitis Sister    Dementia Sister 68       early onset   Asthma Sister     Diverticulitis Sister    Arthritis Sister        x1   Diverticulitis Sister    Arthritis Sister        neck surgery     Review of Systems  Constitutional:  Negative for activity change, appetite change, chills, fatigue, fever and unexpected weight change.  HENT:  Negative for congestion, ear pain, hearing loss, sinus pressure, sinus pain, sore throat and trouble swallowing.   Eyes:  Negative for pain and visual disturbance.  Respiratory:  Negative for cough, chest tightness, shortness of breath and wheezing.   Cardiovascular:  Negative for chest pain, palpitations and leg swelling.  Gastrointestinal:  Negative for abdominal pain, blood in stool, constipation, diarrhea, nausea and vomiting.  Genitourinary:  Negative for difficulty urinating and menstrual problem.  Musculoskeletal:  Negative for arthralgias (thumb pain left; other joints bother her intermittently) and back pain.  Skin:  Negative for rash.  Neurological:  Negative for dizziness, weakness, numbness and headaches.  Hematological:  Negative for adenopathy. Does not bruise/bleed easily.  Psychiatric/Behavioral:  Negative for sleep disturbance and suicidal ideas. The patient is not nervous/anxious.    CBC:  Lab Results  Component Value Date   WBC 5.6 01/31/2019   HGB 13.0 01/31/2019   HCT 38.5 01/31/2019   MCH 32.2 09/12/2010   MCHC 33.8 01/31/2019   RDW 13.1 01/31/2019   PLT 300.0 01/31/2019   CMP: Lab Results  Component Value Date   NA 138 01/31/2019   K 4.1 01/31/2019   CL 103 01/31/2019   CO2 28 01/31/2019   GLUCOSE 91 01/31/2019   BUN 16 01/31/2019   CREATININE 0.65 01/31/2019   GFRAA  09/12/2010    >60        The eGFR has been calculated using the MDRD equation. This calculation has not been validated in all clinical situations. eGFR's persistently <60 mL/min signify possible Chronic Kidney Disease.   CALCIUM 9.3 01/31/2019   PROT 6.3 01/31/2019   BILITOT 0.4 01/31/2019   ALKPHOS 54  01/31/2019   ALT 18 01/31/2019   AST 21 01/31/2019   LIPID: Lab Results  Component Value Date   CHOL 251 (H) 01/31/2019   TRIG 155.0 (H) 01/31/2019   HDL 85.20 01/31/2019   LDLCALC 135 (H) 01/31/2019    Objective:  BP 102/80 (BP Location: Left Arm, Patient Position: Sitting, Cuff Size: Normal)   Pulse 62   Temp 97.9 F (36.6 C) (Oral)   Ht 5' 5.75" (1.67 m)   Wt 148 lb 3.2 oz (67.2 kg)   SpO2 98%   BMI 24.10 kg/m   Weight: 148 lb 3.2 oz (67.2 kg)   BP Readings from Last 3 Encounters:  12/12/20 102/80  01/31/19 100/60  03/14/18 100/68   Wt Readings from Last 3 Encounters:  12/12/20 148 lb 3.2 oz (67.2 kg)  01/31/19 146 lb 6.4 oz (66.4 kg)  03/14/18 147 lb (66.7 kg)    Physical Exam Constitutional:      General: She is not in acute distress.    Appearance: She is well-developed.  HENT:     Head: Normocephalic and atraumatic.     Right Ear: External ear normal.     Left Ear: External ear normal.     Mouth/Throat:     Pharynx: No oropharyngeal exudate.  Eyes:     Conjunctiva/sclera: Conjunctivae normal.     Pupils: Pupils are equal, round, and reactive to light.  Neck:     Thyroid: No thyromegaly.  Cardiovascular:     Rate and Rhythm: Normal rate and regular rhythm.     Heart sounds: Normal heart sounds. No murmur heard.   No friction rub. No gallop.  Pulmonary:     Effort: Pulmonary effort is normal.     Breath sounds: Normal breath sounds.  Abdominal:     General: Bowel sounds are normal. There is no distension.     Palpations: Abdomen is soft. There is no mass.     Tenderness: There is no abdominal tenderness. There is no guarding.     Hernia: No hernia is present.  Musculoskeletal:        General: No tenderness or deformity. Normal range of motion.     Cervical back: Normal range of motion and neck supple.     Comments: Bony enlargement 1st mcp joint left hand  Lymphadenopathy:     Cervical: No cervical adenopathy.  Skin:    General: Skin is warm  and dry.     Findings: No rash.  Neurological:     Mental Status: She is alert and oriented to person, place, and time.     Deep Tendon Reflexes: Reflexes normal.  Reflex Scores:      Tricep reflexes are 2+ on the right side and 2+ on the left side.      Bicep reflexes are 2+ on the right side and 2+ on the left side.      Brachioradialis reflexes are 2+ on the right side and 2+ on the left side.      Patellar reflexes are 2+ on the right side and 2+ on the left side. Psychiatric:        Speech: Speech normal.        Behavior: Behavior normal.        Thought Content: Thought content normal.    Assessment/Plan: Health Maintenance Due  Topic Date Due   Pneumococcal Vaccine 94-69 Years old (1 - PCV) Never done   TETANUS/TDAP  08/12/2020   COVID-19 Vaccine (3 - Mixed Product risk series) 09/02/2020   INFLUENZA VACCINE  11/17/2020   Health Maintenance reviewed.  1. Preventative health care Keep up with active lifestyle. Up to date with HM.  - Tdap vaccine greater than or equal to 7yo IM  2. Pain of left thumb Interested in treatment options/prevention of worsening. Will send for specialty evaluation.  - Ambulatory referral to Hand Surgery  3. Lipid screening - Lipid panel; Future  4. Screening for diabetes mellitus - CBC with Differential/Platelet; Future - Comprehensive metabolic panel; Future  Return in about 1 year (around 12/12/2021) for physical exam.  Micheline Rough, MD

## 2020-12-19 ENCOUNTER — Other Ambulatory Visit: Payer: Self-pay

## 2020-12-19 ENCOUNTER — Other Ambulatory Visit (INDEPENDENT_AMBULATORY_CARE_PROVIDER_SITE_OTHER): Payer: BC Managed Care – PPO

## 2020-12-19 DIAGNOSIS — Z131 Encounter for screening for diabetes mellitus: Secondary | ICD-10-CM

## 2020-12-19 DIAGNOSIS — Z13220 Encounter for screening for lipoid disorders: Secondary | ICD-10-CM | POA: Diagnosis not present

## 2020-12-19 LAB — CBC WITH DIFFERENTIAL/PLATELET
Basophils Absolute: 0 10*3/uL (ref 0.0–0.1)
Basophils Relative: 0.7 % (ref 0.0–3.0)
Eosinophils Absolute: 0.2 10*3/uL (ref 0.0–0.7)
Eosinophils Relative: 2.9 % (ref 0.0–5.0)
HCT: 39 % (ref 36.0–46.0)
Hemoglobin: 13.1 g/dL (ref 12.0–15.0)
Lymphocytes Relative: 35.9 % (ref 12.0–46.0)
Lymphs Abs: 1.9 10*3/uL (ref 0.7–4.0)
MCHC: 33.7 g/dL (ref 30.0–36.0)
MCV: 93.9 fl (ref 78.0–100.0)
Monocytes Absolute: 0.4 10*3/uL (ref 0.1–1.0)
Monocytes Relative: 8.3 % (ref 3.0–12.0)
Neutro Abs: 2.8 10*3/uL (ref 1.4–7.7)
Neutrophils Relative %: 52.2 % (ref 43.0–77.0)
Platelets: 278 10*3/uL (ref 150.0–400.0)
RBC: 4.15 Mil/uL (ref 3.87–5.11)
RDW: 13.8 % (ref 11.5–15.5)
WBC: 5.4 10*3/uL (ref 4.0–10.5)

## 2020-12-19 LAB — COMPREHENSIVE METABOLIC PANEL
ALT: 25 U/L (ref 0–35)
AST: 24 U/L (ref 0–37)
Albumin: 3.9 g/dL (ref 3.5–5.2)
Alkaline Phosphatase: 57 U/L (ref 39–117)
BUN: 15 mg/dL (ref 6–23)
CO2: 29 mEq/L (ref 19–32)
Calcium: 9.1 mg/dL (ref 8.4–10.5)
Chloride: 104 mEq/L (ref 96–112)
Creatinine, Ser: 0.67 mg/dL (ref 0.40–1.20)
GFR: 94.39 mL/min (ref >60.00)
Glucose, Bld: 95 mg/dL (ref 70–99)
Potassium: 3.9 mEq/L (ref 3.5–5.1)
Sodium: 139 mEq/L (ref 135–145)
Total Bilirubin: 0.4 mg/dL (ref 0.2–1.2)
Total Protein: 6.3 g/dL (ref 6.0–8.3)

## 2020-12-19 LAB — LIPID PANEL
Cholesterol: 223 mg/dL — ABNORMAL HIGH (ref 0–200)
HDL: 83.4 mg/dL (ref >39.00)
LDL Cholesterol: 109 mg/dL — ABNORMAL HIGH (ref 0–99)
NonHDL: 139.43
Total CHOL/HDL Ratio: 3
Triglycerides: 152 mg/dL — ABNORMAL HIGH (ref 0.0–149.0)
VLDL: 30.4 mg/dL (ref 0.0–40.0)

## 2020-12-24 DIAGNOSIS — M79642 Pain in left hand: Secondary | ICD-10-CM | POA: Diagnosis not present

## 2020-12-24 DIAGNOSIS — M79641 Pain in right hand: Secondary | ICD-10-CM | POA: Diagnosis not present

## 2020-12-24 DIAGNOSIS — M180 Bilateral primary osteoarthritis of first carpometacarpal joints: Secondary | ICD-10-CM | POA: Diagnosis not present

## 2020-12-24 DIAGNOSIS — M899 Disorder of bone, unspecified: Secondary | ICD-10-CM | POA: Diagnosis not present

## 2021-01-01 DIAGNOSIS — M79642 Pain in left hand: Secondary | ICD-10-CM | POA: Diagnosis not present

## 2021-01-01 DIAGNOSIS — M79641 Pain in right hand: Secondary | ICD-10-CM | POA: Diagnosis not present

## 2021-01-01 DIAGNOSIS — M180 Bilateral primary osteoarthritis of first carpometacarpal joints: Secondary | ICD-10-CM | POA: Diagnosis not present

## 2021-01-01 DIAGNOSIS — M899 Disorder of bone, unspecified: Secondary | ICD-10-CM | POA: Diagnosis not present

## 2021-01-01 DIAGNOSIS — M19032 Primary osteoarthritis, left wrist: Secondary | ICD-10-CM | POA: Diagnosis not present

## 2021-01-01 DIAGNOSIS — M189 Osteoarthritis of first carpometacarpal joint, unspecified: Secondary | ICD-10-CM | POA: Diagnosis not present

## 2021-01-21 DIAGNOSIS — M180 Bilateral primary osteoarthritis of first carpometacarpal joints: Secondary | ICD-10-CM | POA: Diagnosis not present

## 2021-01-21 DIAGNOSIS — M19032 Primary osteoarthritis, left wrist: Secondary | ICD-10-CM | POA: Diagnosis not present

## 2021-02-27 ENCOUNTER — Ambulatory Visit: Payer: BC Managed Care – PPO | Admitting: Family Medicine

## 2021-02-27 ENCOUNTER — Encounter: Payer: Self-pay | Admitting: Family Medicine

## 2021-02-27 VITALS — BP 120/68 | HR 70 | Temp 97.8°F | Wt 151.0 lb

## 2021-02-27 DIAGNOSIS — J029 Acute pharyngitis, unspecified: Secondary | ICD-10-CM | POA: Diagnosis not present

## 2021-02-27 DIAGNOSIS — R0982 Postnasal drip: Secondary | ICD-10-CM

## 2021-02-27 MED ORDER — FLUTICASONE PROPIONATE 50 MCG/ACT NA SUSP: 1.0000 | Freq: Every day | NASAL | 0 refills | Status: DC

## 2021-02-27 NOTE — Progress Notes (Signed)
Subjective:    Patient ID: Jordan Khan, female    DOB: 11/15/1960, 60 y.o.   MRN: [id number]  Chief Complaint  Patient presents with   Sore Throat    Sore throat for about a week on and off. Feels swollen when swallowing.took delsym when had a cough with it, tylenol for the soreness, but has not taken anything today    HPI Patient was seen today for acute concern.  Pt endorses continue sore throat off and on x 1 wk.  Pt state throat feels swollen.  Tried ibuprofen.  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  They recently had issues with RSV.  At home COVID testing negative x2, this Wednesday and last Wednesday.  Past Medical History:  Diagnosis Date   Arthritis    Basal cell carcinoma 08/13/2010   sees dermatologist   Genital warts    Hot flashes    and vaginal atrophy - sees Dr. Marvel Plan   IBS (irritable bowel syndrome)    Kidney stones    Right ear pain    Rotator cuff syndrome of left shoulder 08/13/2010    No Known Allergies  ROS General: Denies fever, chills, night sweats, changes in weight, changes in appetite HEENT: Denies headaches, ear pain, changes in vision, rhinorrhea  +sore throat CV: Denies CP, palpitations, SOB, orthopnea Pulm: Denies SOB, cough, wheezing GI: Denies abdominal pain, nausea, vomiting, diarrhea, constipation GU: Denies dysuria, hematuria, frequency, vaginal discharge Msk: Denies muscle cramps, joint pains Neuro: Denies weakness, numbness, tingling Skin: Denies rashes, bruising Psych: Denies depression, anxiety, hallucinations     Objective:    Blood pressure 120/68, pulse 70, temperature 97.8 F (36.6 C), temperature source Oral, weight 151 lb (68.5 kg), SpO2 96 %.   Gen. Pleasant, well-nourished, in no distress, normal affect   HEENT: [place]/AT, face symmetric, conjunctiva clear, no scleral icterus, PERRLA, EOMI, nares patent without drainage, pharynx with clear postnasal drainage and mild erythema, no exudate.  TMs normal b/l.  No cervical lymphadenopathy. Lungs: no accessory muscle use, CTAB, no wheezes or rales Cardiovascular: RRR, no m/r/g, no peripheral edema Musculoskeletal: No deformities, no cyanosis or clubbing, normal tone Neuro:  A&Ox3, CN II-XII intact, normal gait Skin:  Warm, no lesions/ rash   Wt Readings from Last 3 Encounters:  02/27/21 151 lb (68.5 kg)  12/12/20 148 lb 3.2 oz (67.2 kg)  01/31/19 146 lb 6.4 oz (66.4 kg)    Lab Results  Component Value Date   WBC 5.4 12/19/2020   HGB 13.1 12/19/2020   HCT 39.0 12/19/2020   PLT 278.0 12/19/2020   GLUCOSE 95 12/19/2020   CHOL 223 (H) 12/19/2020   TRIG 152.0 (H) 12/19/2020   HDL 83.40 12/19/2020   LDLDIRECT 133.3 08/13/2010   LDLCALC 109 (H) 12/19/2020   ALT 25 12/19/2020   AST 24 12/19/2020   NA 139 12/19/2020   K 3.9 12/19/2020   CL 104 12/19/2020   CREATININE 0.67 12/19/2020   BUN 15 12/19/2020   CO2 29 12/19/2020   TSH 1.35 01/31/2019   HGBA1C 5.5 01/31/2019    Assessment/Plan:  Post-nasal drainage - Plan: fluticasone (FLONASE) 50 MCG/ACT nasal spray  Sore throat  Symptoms likely 2/2 postnasal drainage from viral etiology versus allergens.  Discussed continuing supportive care including gargling with warm salt water or Chloraseptic spray Tylenol or NSAIDs as needed for any pain/discomfort.  Okay to use OTC Flonase or allergy medication.  Given handout.  Given precautions  F/u prn  Larene Beach  Volanda Napoleon, MD

## 2021-03-25 DIAGNOSIS — L578 Other skin changes due to chronic exposure to nonionizing radiation: Secondary | ICD-10-CM | POA: Diagnosis not present

## 2021-03-25 DIAGNOSIS — D2372 Other benign neoplasm of skin of left lower limb, including hip: Secondary | ICD-10-CM | POA: Diagnosis not present

## 2021-03-25 DIAGNOSIS — D2272 Melanocytic nevi of left lower limb, including hip: Secondary | ICD-10-CM | POA: Diagnosis not present

## 2021-03-25 DIAGNOSIS — L570 Actinic keratosis: Secondary | ICD-10-CM | POA: Diagnosis not present

## 2021-03-25 DIAGNOSIS — L821 Other seborrheic keratosis: Secondary | ICD-10-CM | POA: Diagnosis not present

## 2021-07-02 ENCOUNTER — Encounter: Payer: Self-pay | Admitting: Family Medicine

## 2021-07-02 DIAGNOSIS — R8290 Unspecified abnormal findings in urine: Secondary | ICD-10-CM

## 2021-07-23 ENCOUNTER — Telehealth: Payer: Self-pay | Admitting: *Deleted

## 2021-07-23 ENCOUNTER — Other Ambulatory Visit: Payer: BC Managed Care – PPO

## 2021-07-23 DIAGNOSIS — R8290 Unspecified abnormal findings in urine: Secondary | ICD-10-CM

## 2021-07-23 NOTE — Telephone Encounter (Signed)
Jordan Khan left a note stating the lab requested the lab orders be placed as future for the patient to schedule an appt.  After reviewing the Mychart note from PCP on 3/18, urine orders were re-entered as requested. ?

## 2021-07-28 ENCOUNTER — Other Ambulatory Visit (INDEPENDENT_AMBULATORY_CARE_PROVIDER_SITE_OTHER): Payer: BC Managed Care – PPO

## 2021-07-28 DIAGNOSIS — N3000 Acute cystitis without hematuria: Secondary | ICD-10-CM | POA: Diagnosis not present

## 2021-07-28 DIAGNOSIS — R8290 Unspecified abnormal findings in urine: Secondary | ICD-10-CM

## 2021-07-31 LAB — CULTURE INDICATED

## 2021-07-31 LAB — URINALYSIS W MICROSCOPIC + REFLEX CULTURE
Bacteria, UA: NONE SEEN /HPF
Bilirubin Urine: NEGATIVE
Glucose, UA: NEGATIVE
Hgb urine dipstick: NEGATIVE
Hyaline Cast: NONE SEEN /LPF
Ketones, ur: NEGATIVE
Nitrites, Initial: NEGATIVE
Protein, ur: NEGATIVE
RBC / HPF: NONE SEEN /HPF (ref 0–2)
Specific Gravity, Urine: 1.007 (ref 1.001–1.035)
Squamous Epithelial / HPF: NONE SEEN /HPF (ref <=5)
WBC, UA: NONE SEEN /HPF (ref 0–5)
pH: 6.5 (ref 5.0–8.0)

## 2021-07-31 LAB — URINE CULTURE
MICRO NUMBER:: 13251569
SPECIMEN QUALITY:: ADEQUATE

## 2021-08-02 ENCOUNTER — Other Ambulatory Visit: Payer: Self-pay | Admitting: Family Medicine

## 2021-08-02 MED ORDER — CEPHALEXIN 500 MG PO CAPS: 500.0000 mg | ORAL_CAPSULE | Freq: Two times a day (BID) | ORAL | 0 refills | Status: DC

## 2021-08-05 ENCOUNTER — Encounter: Payer: Self-pay | Admitting: Family Medicine

## 2021-08-05 DIAGNOSIS — N3000 Acute cystitis without hematuria: Secondary | ICD-10-CM

## 2021-08-21 ENCOUNTER — Other Ambulatory Visit: Payer: BC Managed Care – PPO

## 2021-08-21 DIAGNOSIS — N3000 Acute cystitis without hematuria: Secondary | ICD-10-CM

## 2021-08-23 LAB — URINE CULTURE
MICRO NUMBER:: 13357725
SPECIMEN QUALITY:: ADEQUATE

## 2021-08-24 ENCOUNTER — Other Ambulatory Visit: Payer: Self-pay | Admitting: Family Medicine

## 2021-08-24 ENCOUNTER — Telehealth: Payer: Self-pay | Admitting: Family Medicine

## 2021-08-24 MED ORDER — NITROFURANTOIN MONOHYD MACRO 100 MG PO CAPS: 100.0000 mg | ORAL_CAPSULE | Freq: Two times a day (BID) | ORAL | 0 refills | Status: AC

## 2021-08-24 NOTE — Telephone Encounter (Signed)
Mychart message sent as below.  

## 2021-08-24 NOTE — Telephone Encounter (Signed)
Pt call and stated she sent a mychart message and want to let dr. Ethlyn Gallery know she is going out of town and need the med for a UTI before she good . ?

## 2021-09-28 DIAGNOSIS — R202 Paresthesia of skin: Secondary | ICD-10-CM | POA: Diagnosis not present

## 2021-09-28 DIAGNOSIS — L821 Other seborrheic keratosis: Secondary | ICD-10-CM | POA: Diagnosis not present

## 2021-09-28 DIAGNOSIS — L570 Actinic keratosis: Secondary | ICD-10-CM | POA: Diagnosis not present

## 2021-10-05 ENCOUNTER — Telehealth: Payer: Self-pay | Admitting: Family Medicine

## 2021-10-05 DIAGNOSIS — M545 Low back pain, unspecified: Secondary | ICD-10-CM

## 2021-10-05 NOTE — Telephone Encounter (Signed)
Pt had lab orders placed at [facility] but want to get them done here. Lab Orders request has been placed in folder; pt would like for Dr. to place those same orders for our lab.   Please advise.

## 2021-10-06 NOTE — Telephone Encounter (Signed)
Order placed in [facility] Webb's folder.

## 2021-10-07 DIAGNOSIS — B9689 Other specified bacterial agents as the cause of diseases classified elsewhere: Secondary | ICD-10-CM | POA: Diagnosis not present

## 2021-10-07 DIAGNOSIS — R350 Frequency of micturition: Secondary | ICD-10-CM | POA: Diagnosis not present

## 2021-10-07 DIAGNOSIS — N390 Urinary tract infection, site not specified: Secondary | ICD-10-CM | POA: Diagnosis not present

## 2021-10-07 NOTE — Telephone Encounter (Signed)
Lab ordered and appointment scheduled.

## 2021-10-07 NOTE — Addendum Note (Signed)
Addended by: Westley Hummer B on: 10/07/2021 03:20 PM   Modules accepted: Orders

## 2021-10-09 ENCOUNTER — Other Ambulatory Visit (INDEPENDENT_AMBULATORY_CARE_PROVIDER_SITE_OTHER): Payer: BC Managed Care – PPO

## 2021-10-09 ENCOUNTER — Other Ambulatory Visit: Payer: BC Managed Care – PPO

## 2021-10-09 DIAGNOSIS — M545 Low back pain, unspecified: Secondary | ICD-10-CM

## 2021-10-09 LAB — CBC WITH DIFFERENTIAL/PLATELET
Basophils Absolute: 0 10*3/uL (ref 0.0–0.1)
Basophils Relative: 0.5 % (ref 0.0–3.0)
Eosinophils Absolute: 0.2 10*3/uL (ref 0.0–0.7)
Eosinophils Relative: 2.2 % (ref 0.0–5.0)
HCT: 35.7 % — ABNORMAL LOW (ref 36.0–46.0)
Hemoglobin: 12 g/dL (ref 12.0–15.0)
Lymphocytes Relative: 27.7 % (ref 12.0–46.0)
Lymphs Abs: 1.9 10*3/uL (ref 0.7–4.0)
MCHC: 33.5 g/dL (ref 30.0–36.0)
MCV: 94.7 fl (ref 78.0–100.0)
Monocytes Absolute: 0.5 10*3/uL (ref 0.1–1.0)
Monocytes Relative: 6.5 % (ref 3.0–12.0)
Neutro Abs: 4.4 10*3/uL (ref 1.4–7.7)
Neutrophils Relative %: 63.1 % (ref 43.0–77.0)
Platelets: 294 10*3/uL (ref 150.0–400.0)
RBC: 3.77 Mil/uL — ABNORMAL LOW (ref 3.87–5.11)
RDW: 14.1 % (ref 11.5–15.5)
WBC: 7 10*3/uL (ref 4.0–10.5)

## 2021-10-09 LAB — URIC ACID: Uric Acid, Serum: 3.6 mg/dL (ref 2.4–7.0)

## 2021-10-09 LAB — SEDIMENTATION RATE: Sed Rate: 8 mm/hr (ref 0–30)

## 2021-10-09 LAB — C-REACTIVE PROTEIN: CRP: <1 mg/dL (ref 0.5–20.0)

## 2021-10-12 DIAGNOSIS — R8271 Bacteriuria: Secondary | ICD-10-CM | POA: Diagnosis not present

## 2021-10-14 ENCOUNTER — Other Ambulatory Visit: Payer: Self-pay | Admitting: Family

## 2021-10-14 DIAGNOSIS — R760 Raised antibody titer: Secondary | ICD-10-CM

## 2021-10-18 LAB — TIER 3
Centromere Ab Screen: >8 AI — AB
Ribosomal P Protein Ab: <1 AI

## 2021-10-18 LAB — ANTI-NUCLEAR AB-TITER (ANA TITER)
ANA TITER: 1:320 {titer} — ABNORMAL HIGH
ANA Titer 1: 1:320 {titer} — ABNORMAL HIGH

## 2021-10-18 LAB — ANA SCREEN,IFA,REFLEX TITER/PATTERN,REFLEX MPLX 11 AB CASCADE
14-3-3 eta Protein: <0.2 ng/mL (ref <0.2)
Anti Nuclear Antibody (ANA): POSITIVE — AB
Cyclic Citrullin Peptide Ab: <16 UNITS
Rheumatoid fact SerPl-aCnc: <14 IU/mL (ref <14)

## 2021-10-18 LAB — COMPLETE METABOLIC PANEL WITH GFR
AG Ratio: 1.8 (calc) (ref 1.0–2.5)
ALT: 19 U/L (ref 6–29)
AST: 24 U/L (ref 10–35)
Albumin: 3.9 g/dL (ref 3.6–5.1)
Alkaline phosphatase (APISO): 50 U/L (ref 37–153)
BUN: 15 mg/dL (ref 7–25)
CO2: 27 mmol/L (ref 20–32)
Calcium: 8.9 mg/dL (ref 8.6–10.4)
Chloride: 104 mmol/L (ref 98–110)
Creat: 0.7 mg/dL (ref 0.50–1.05)
Globulin: 2.2 g/dL (calc) (ref 1.9–3.7)
Glucose, Bld: 110 mg/dL — ABNORMAL HIGH (ref 65–99)
Potassium: 4.1 mmol/L (ref 3.5–5.3)
Sodium: 139 mmol/L (ref 135–146)
Total Bilirubin: 0.3 mg/dL (ref 0.2–1.2)
Total Protein: 6.1 g/dL (ref 6.1–8.1)
eGFR: 98 mL/min/{1.73_m2} (ref >=60)

## 2021-10-18 LAB — TIER 2
Jo-1 Autoabs: <1 AI
SSA (Ro) (ENA) Antibody, IgG: <1 AI
SSB (La) (ENA) Antibody, IgG: <1 AI
Scleroderma (Scl-70) (ENA) Antibody, IgG: <1 AI

## 2021-10-18 LAB — TIER 1
Chromatin (Nucleosomal) Antibody: <1 AI
ENA SM Ab Ser-aCnc: <1 AI
Ribonucleic Protein(ENA) Antibody, IgG: <1 AI
SM/RNP: <1 AI
ds DNA Ab: <1 IU/mL

## 2021-10-18 LAB — INTERPRETATION

## 2021-11-04 DIAGNOSIS — N200 Calculus of kidney: Secondary | ICD-10-CM | POA: Diagnosis not present

## 2021-11-04 DIAGNOSIS — R350 Frequency of micturition: Secondary | ICD-10-CM | POA: Diagnosis not present

## 2021-12-09 DIAGNOSIS — Z1389 Encounter for screening for other disorder: Secondary | ICD-10-CM | POA: Diagnosis not present

## 2021-12-09 DIAGNOSIS — E559 Vitamin D deficiency, unspecified: Secondary | ICD-10-CM | POA: Diagnosis not present

## 2021-12-09 DIAGNOSIS — Z01419 Encounter for gynecological examination (general) (routine) without abnormal findings: Secondary | ICD-10-CM | POA: Diagnosis not present

## 2021-12-09 DIAGNOSIS — Z1231 Encounter for screening mammogram for malignant neoplasm of breast: Secondary | ICD-10-CM | POA: Diagnosis not present

## 2021-12-09 DIAGNOSIS — N959 Unspecified menopausal and perimenopausal disorder: Secondary | ICD-10-CM | POA: Diagnosis not present

## 2021-12-09 LAB — HM MAMMOGRAPHY

## 2021-12-11 DIAGNOSIS — M79672 Pain in left foot: Secondary | ICD-10-CM | POA: Diagnosis not present

## 2022-01-08 DIAGNOSIS — M2021 Hallux rigidus, right foot: Secondary | ICD-10-CM | POA: Diagnosis not present

## 2022-01-08 DIAGNOSIS — M79672 Pain in left foot: Secondary | ICD-10-CM | POA: Diagnosis not present

## 2022-03-24 DIAGNOSIS — D2372 Other benign neoplasm of skin of left lower limb, including hip: Secondary | ICD-10-CM | POA: Diagnosis not present

## 2022-03-24 DIAGNOSIS — L570 Actinic keratosis: Secondary | ICD-10-CM | POA: Diagnosis not present

## 2022-03-24 DIAGNOSIS — L578 Other skin changes due to chronic exposure to nonionizing radiation: Secondary | ICD-10-CM | POA: Diagnosis not present

## 2022-03-24 DIAGNOSIS — D225 Melanocytic nevi of trunk: Secondary | ICD-10-CM | POA: Diagnosis not present

## 2022-03-24 DIAGNOSIS — L821 Other seborrheic keratosis: Secondary | ICD-10-CM | POA: Diagnosis not present

## 2022-04-08 NOTE — Progress Notes (Signed)
Office Visit Note  Patient: Jordan Khan             Date of Birth: [birth year]           MRN: [id number]             PCP: Caren Macadam, MD (Inactive) Referring: Kennyth Arnold, FNP Visit Date: 04/22/2022 Occupation: _0 @  Subjective:   Pain in multiple joints and positive ANA  History of Present Illness: Jordan Khan is a 61 y.o. female seen in consultation per request of her PCP.  According to the patient she has had arthritis in her feet for many years.  She had right foot bunionectomy about 6 years ago.  She also had calluses under her feet.  She developed a stress fracture in her left foot last year.  She has had intermittent shoulder joint pain in the past.  She sees Dr. Delilah Shan at [facility] who has diagnosed her with rotator cuff tendinopathy.  She has had discomfort in her bilateral hands for the last 2 years.  She states she had seen a hand specialist and was given a [place] brace which she does not use.  She is pain mostly in her left thumb.  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  She also plays pickle ball and practices yoga.  She states that in June 2023 she started having lower back pain.  She was evaluated by Dr. Delilah Shan who did x-rays which were unremarkable.  He recommended lab work as she was having pain in multiple joints.  She had labs done by her PCP which showed positive ANA.  She was referred to me for further evaluation.  She gives history of photosensitivity and joint pain.  There is no history of oral ulcers, nasal ulcers, malar rash, Raynaud's phenomenon, lymphadenopathy or inflammatory arthritis.  There is no family history of autoimmune disease.  She is gravida 3, para 3, miscarriages 0.  There is no history of preeclampsia.    Activities of Daily Living:  Patient reports morning stiffness for a few minutes.   Patient Denies nocturnal pain.  Difficulty dressing/grooming: Denies Difficulty  climbing stairs: Denies Difficulty getting out of chair: Denies Difficulty using hands for taps, buttons, cutlery, and/or writing: Denies  Review of Systems  Constitutional:  Negative for fatigue.  HENT:  Negative for mouth sores and mouth dryness.   Eyes:  Negative for dryness.  Respiratory:  Negative for shortness of breath.   Cardiovascular:  Negative for chest pain and palpitations.  Gastrointestinal:  Negative for blood in stool, constipation and diarrhea.  Endocrine: Negative for increased urination.  Genitourinary:  Negative for involuntary urination.  Musculoskeletal:  Positive for joint pain, joint pain and morning stiffness. Negative for gait problem, joint swelling, myalgias, muscle weakness, muscle tenderness and myalgias.  Skin:  Positive for sensitivity to sunlight. Negative for color change, rash and hair loss.  Allergic/Immunologic: Negative for susceptible to infections.  Neurological:  Negative for dizziness and headaches.  Hematological:  Negative for swollen glands.  Psychiatric/Behavioral:  Negative for depressed mood and sleep disturbance. The patient is not nervous/anxious.     PMFS History:  Patient Active Problem List   Diagnosis Date Noted   Ear pain, right 04/22/2012   Basal cell carcinoma 08/13/2010    Past Medical History:  Diagnosis Date   Arthritis    Basal cell carcinoma 08/13/2010   sees dermatologist   Genital warts  Hot flashes    and vaginal atrophy - sees Dr. Marvel Plan   IBS (irritable bowel syndrome)    Kidney stones    Right ear pain    Rotator cuff syndrome of left shoulder 08/13/2010    Family History  Problem Relation Age of Onset   Hypertension Mother    Dementia Mother 57   Stroke Mother 67   Arthritis Father    Hyperlipidemia Father 55       Deceased   Heart disease Father 47       MI   Dementia Father    Hyperlipidemia Sister    Hypertension Sister    Hypertension Sister    Diverticulitis Sister    Dementia Sister  40       early onset   Asthma Sister    Diverticulitis Sister    Arthritis Sister        x1   Diverticulitis Sister    Arthritis Sister        neck surgery   Stroke Maternal Grandmother 8   Alcohol abuse Maternal Grandfather    Stroke Paternal Grandmother 38   Heart disease Paternal Grandfather    Healthy Son    Healthy Son    Healthy Daughter    Past Surgical History:  Procedure Laterality Date   ABDOMINAL HYSTERECTOMY  2002   done for endometriosis. one ovary remains   APPENDECTOMY  2002   removed at time of hysterctomy   BASAL CELL CARCINOMA EXCISION  2010   FOOT SURGERY  2018   right foot   WISDOM TOOTH EXTRACTION     Social History   Social History Narrative   Work or School: works part time preschool      Home Situation: lives with husband and son is in college in 2016      Spiritual Beliefs: Christian      Lifestyle: walking and yoga; diet is healthy      Immunization History  Administered Date(s) Administered   Influenza,inj,Quad PF,6+ Mos 01/31/2019   Influenza-Unspecified 12/18/2017   Moderna Sars-Covid-2 Vaccination 02/29/2020   PFIZER(Purple Top)SARS-COV-2 Vaccination 08/05/2020   Tdap 08/13/2010, 12/12/2020   Zoster Recombinat (Shingrix) 09/27/2017, 11/29/2017     Objective: Vital Signs: BP 120/75 (BP Location: Right Arm, Patient Position: Sitting, Cuff Size: Normal)   Pulse 66   Resp 14   Ht _0  (1.676 m)   Wt 153 lb 3.2 oz (69.5 kg)   BMI 24.73 kg/m    Physical Exam Vitals and nursing note reviewed.  Constitutional:      Appearance: She is well-developed.  HENT:     Head: Normocephalic and atraumatic.  Eyes:     Conjunctiva/sclera: Conjunctivae normal.  Cardiovascular:     Rate and Rhythm: Normal rate and regular rhythm.     Heart sounds: Normal heart sounds.  Pulmonary:     Effort: Pulmonary effort is normal.     Breath sounds: Normal breath sounds.  Abdominal:     General: Bowel sounds are normal.     Palpations: Abdomen  is soft.  Musculoskeletal:     Cervical back: Normal range of motion.  Lymphadenopathy:     Cervical: No cervical adenopathy.  Skin:    General: Skin is warm and dry.     Capillary Refill: Capillary refill takes less than 2 seconds.  Neurological:     Mental Status: She is alert and oriented to person, place, and time.  Psychiatric:        Behavior:  Behavior normal.      Musculoskeletal Exam: Cervical thoracic and lumbar spine were in good range of motion.  She had no point tenderness over the lumbar region.  Shoulder joints, elbow joints, wrist joints were in good range of motion.  She had bilateral PIP and DIP thickening and CMC thickening with subluxation.  Hip joints and knee joints were in good range of motion without any warmth swelling or effusion.  There was no tenderness over ankles.  She had bilateral pes cavus and hammertoes.  She had bunionectomy scar on her right first MTP joint.  No synovitis was noted.  CDAI Exam: CDAI Score: -- Patient Global: --; Provider Global: -- Swollen: --; Tender: -- Joint Exam 04/22/2022   No joint exam has been documented for this visit   There is currently no information documented on the homunculus. Go to the Rheumatology activity and complete the homunculus joint exam.  Investigation: No additional findings.  Imaging: No results found.  Recent Labs: Lab Results  Component Value Date   WBC 7.0 10/09/2021   HGB 12.0 10/09/2021   PLT 294.0 10/09/2021   NA 139 10/09/2021   K 4.1 10/09/2021   CL 104 10/09/2021   CO2 27 10/09/2021   GLUCOSE 110 (H) 10/09/2021   BUN 15 10/09/2021   CREATININE 0.70 10/09/2021   BILITOT 0.3 10/09/2021   ALKPHOS 57 12/19/2020   AST 24 10/09/2021   ALT 19 10/09/2021   PROT 6.1 10/09/2021   ALBUMIN 3.9 12/19/2020   CALCIUM 8.9 10/09/2021   GFRAA  09/12/2010    >60        The eGFR has been calculated using the MDRD equation. This calculation has not been validated in all  clinical situations. eGFR's persistently <60 mL/min signify possible Chronic Kidney Disease.    Speciality Comments: No specialty comments available.  Procedures:  No procedures performed Allergies: Patient has no known allergies.   Assessment / Plan:     Visit Diagnoses: Positive ANA (antinuclear antibody) -patient was complaining of arthralgias in June of last year at the time she had labs.  Her ANA came positive.  ENA panel including double-stranded DNA, Smith, SSA, SSB, SCL 70 RNP were all within normal limits.  She denies any history of oral ulcers, nasal ulcers, malar rash, Raynaud's phenomenon or lymphadenopathy.  She states she has always had photosensitivity.  She has arthralgias but no history of inflammatory arthritis.  Prevalence of positive ANA and normal population was discussed.  At this time she is asymptomatic.  I advised her to contact me if she develops any new symptoms.  10/09/21: ANA 1:320NH, 1:320NS, CRP<1, uric acid 3.6, ESR 8, RF<14, anti-CCP<16, 14-3-3 eta<0.2, Centromere ab>8, rest of ENA negative  Primary osteoarthritis of both hands-she has osteoarthritis in the bilateral hands involving her CMC joints PIP and DIP joints.  She has a [place] brace for her left hand which she has not been using.  I gave her [place] brace for the right hand.  Joint protection muscle strengthening was discussed.  A handout on hand exercises was given.  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  Primary osteoarthritis of both feet-she gives history of osteoarthritis in her feet for many years.  She had calluses under her metatarsals.  She has hammertoes.  Proper fitting shoes with arch support and metatarsal pads were advised.  I advised her to do some muscle strengthening exercises.  I also advised her  to contact her podiatrist for possible orthotics.  Pes cavus of both feet-she has significant pes cavus which causes callus formation under  metatarsals.  She also gets infrequent [place] fasciitis.  She will benefit from orthotics.  Chronic midline low back pain without sciatica-she had recent episode of back strain which improved gradually.  She states she gets infrequent lower back discomfort.  She practices yoga on a regular basis.  Core strengthening exercises were demonstrated and discussed.  A handout on back exercises was given.  History of basal cell carcinoma - Followed by Dr. Delman Cheadle  Recurrent nephrolithiasis-patient had minor symptoms of nephrolithiasis.  She has never passed kidney stones.  Orders: No orders of the defined types were placed in this encounter.  No orders of the defined types were placed in this encounter.    Follow-Up Instructions: Return in about 1 year (around 04/23/2023) for Osteoarthritis.   Bo Merino, MD  Note - This record has been created using Editor, commissioning.  Chart creation errors have been sought, but may not always  have been located. Such creation errors do not reflect on  the standard of medical care.

## 2022-04-22 ENCOUNTER — Encounter: Payer: Self-pay | Admitting: Rheumatology

## 2022-04-22 ENCOUNTER — Ambulatory Visit: Payer: BC Managed Care – PPO | Attending: Rheumatology | Admitting: Rheumatology

## 2022-04-22 VITALS — BP 120/75 | HR 66 | Resp 14 | Ht 66.0 in | Wt 153.2 lb

## 2022-04-22 DIAGNOSIS — M19041 Primary osteoarthritis, right hand: Secondary | ICD-10-CM

## 2022-04-22 DIAGNOSIS — N200 Calculus of kidney: Secondary | ICD-10-CM

## 2022-04-22 DIAGNOSIS — Q6672 Congenital pes cavus, left foot: Secondary | ICD-10-CM

## 2022-04-22 DIAGNOSIS — Z85828 Personal history of other malignant neoplasm of skin: Secondary | ICD-10-CM

## 2022-04-22 DIAGNOSIS — M19071 Primary osteoarthritis, right ankle and foot: Secondary | ICD-10-CM

## 2022-04-22 DIAGNOSIS — G8929 Other chronic pain: Secondary | ICD-10-CM

## 2022-04-22 DIAGNOSIS — R768 Other specified abnormal immunological findings in serum: Secondary | ICD-10-CM | POA: Diagnosis not present

## 2022-04-22 DIAGNOSIS — Q6671 Congenital pes cavus, right foot: Secondary | ICD-10-CM | POA: Diagnosis not present

## 2022-04-22 DIAGNOSIS — M19042 Primary osteoarthritis, left hand: Secondary | ICD-10-CM

## 2022-04-22 DIAGNOSIS — M19072 Primary osteoarthritis, left ankle and foot: Secondary | ICD-10-CM

## 2022-04-22 DIAGNOSIS — M545 Low back pain, unspecified: Secondary | ICD-10-CM

## 2022-04-22 NOTE — Patient Instructions (Signed)
Hand Exercises Hand exercises can be helpful for almost anyone. These exercises can strengthen the hands, improve flexibility and movement, and increase blood flow to the hands. These results can make work and daily tasks easier. Hand exercises can be especially helpful for people who have joint pain from arthritis or have nerve damage from overuse (carpal tunnel syndrome). These exercises can also help people who have injured a hand. Exercises Most of these hand exercises are gentle stretching and motion exercises. It is usually safe to do them often throughout the day. Warming up your hands before exercise may help to reduce stiffness. You can do this with gentle massage or by placing your hands in warm water for 10-15 minutes. It is normal to feel some stretching, pulling, tightness, or mild discomfort as you begin new exercises. This will gradually improve. Stop an exercise right away if you feel sudden, severe pain or your pain gets worse. Ask your health care provider which exercises are best for you. Knuckle bend or "claw" fist  Stand or sit with your arm, hand, and all five fingers pointed straight up. Make sure to keep your wrist straight during the exercise. Gently bend your fingers down toward your palm until the tips of your fingers are touching the top of your palm. Keep your big knuckle straight and just bend the small knuckles in your fingers. Hold this position for __________ seconds. Straighten (extend) your fingers back to the starting position. Repeat this exercise 5-10 times with each hand. Full finger fist  Stand or sit with your arm, hand, and all five fingers pointed straight up. Make sure to keep your wrist straight during the exercise. Gently bend your fingers into your palm until the tips of your fingers are touching the middle of your palm. Hold this position for __________ seconds. Extend your fingers back to the starting position, stretching every joint fully. Repeat  this exercise 5-10 times with each hand. Straight fist Stand or sit with your arm, hand, and all five fingers pointed straight up. Make sure to keep your wrist straight during the exercise. Gently bend your fingers at the big knuckle, where your fingers meet your hand, and the middle knuckle. Keep the knuckle at the tips of your fingers straight and try to touch the bottom of your palm. Hold this position for __________ seconds. Extend your fingers back to the starting position, stretching every joint fully. Repeat this exercise 5-10 times with each hand. Tabletop  Stand or sit with your arm, hand, and all five fingers pointed straight up. Make sure to keep your wrist straight during the exercise. Gently bend your fingers at the big knuckle, where your fingers meet your hand, as far down as you can while keeping the small knuckles in your fingers straight. Think of forming a tabletop with your fingers. Hold this position for __________ seconds. Extend your fingers back to the starting position, stretching every joint fully. Repeat this exercise 5-10 times with each hand. Finger spread  Place your hand flat on a table with your palm facing down. Make sure your wrist stays straight as you do this exercise. Spread your fingers and thumb apart from each other as far as you can until you feel a gentle stretch. Hold this position for __________ seconds. Bring your fingers and thumb tight together again. Hold this position for __________ seconds. Repeat this exercise 5-10 times with each hand. Making circles  Stand or sit with your arm, hand, and all five fingers pointed   straight up. Make sure to keep your wrist straight during the exercise. Make a circle by touching the tip of your thumb to the tip of your index finger. Hold for __________ seconds. Then open your hand wide. Repeat this motion with your thumb and each finger on your hand. Repeat this exercise 5-10 times with each hand. Thumb  motion  Sit with your forearm resting on a table and your wrist straight. Your thumb should be facing up toward the ceiling. Keep your fingers relaxed as you move your thumb. Lift your thumb up as high as you can toward the ceiling. Hold for __________ seconds. Bend your thumb across your palm as far as you can, reaching the tip of your thumb for the small finger (pinkie) side of your palm. Hold for __________ seconds. Repeat this exercise 5-10 times with each hand. Grip strengthening  Hold a stress ball or other soft ball in the middle of your hand. Slowly increase the pressure, squeezing the ball as much as you can without causing pain. Think of bringing the tips of your fingers into the middle of your palm. All of your finger joints should bend when doing this exercise. Hold your squeeze for __________ seconds, then relax. Repeat this exercise 5-10 times with each hand. Contact a health care provider if: Your hand pain or discomfort gets much worse when you do an exercise. Your hand pain or discomfort does not improve within 2 hours after you exercise. If you have any of these problems, stop doing these exercises right away. Do not do them again unless your health care provider says that you can. Get help right away if: You develop sudden, severe hand pain or swelling. If this happens, stop doing these exercises right away. Do not do them again unless your health care provider says that you can. This information is not intended to replace advice given to you by your health care provider. Make sure you discuss any questions you have with your health care provider. Document Revised: 07/24/2020 Document Reviewed: 07/24/2020 Elsevier Patient Education  [place]. Back Exercises The following exercises strengthen the muscles that help to support the trunk (torso) and back. They also help to keep the lower back flexible. Doing these exercises can help to prevent or lessen existing low  back pain. If you have back pain or discomfort, try doing these exercises 2-3 times each day or as told by your health care provider. As your pain improves, do them once each day, but increase the number of times that you repeat the steps for each exercise (do more repetitions). To prevent the recurrence of back pain, continue to do these exercises once each day or as told by your health care provider. Do exercises exactly as told by your health care provider and adjust them as directed. It is normal to feel mild stretching, pulling, tightness, or discomfort as you do these exercises, but you should stop right away if you feel sudden pain or your pain gets worse. Exercises Single knee to chest Repeat these steps 3-5 times for each leg: Lie on your back on a firm bed or the floor with your legs extended. Bring one knee to your chest. Your other leg should stay extended and in contact with the floor. Hold your knee in place by grabbing your knee or thigh with both hands and hold. Pull on your knee until you feel a gentle stretch in your lower back or buttocks. Hold the stretch for 10-30 seconds.  Slowly release and straighten your leg.  Pelvic tilt Repeat these steps 5-10 times: Lie on your back on a firm bed or the floor with your legs extended. Bend your knees so they are pointing toward the ceiling and your feet are flat on the floor. Tighten your lower abdominal muscles to press your lower back against the floor. This motion will tilt your pelvis so your tailbone points up toward the ceiling instead of pointing to your feet or the floor. With gentle tension and even breathing, hold this position for 5-10 seconds.  Cat-cow Repeat these steps until your lower back becomes more flexible: Get into a hands-and-knees position on a firm bed or the floor. Keep your hands under your shoulders, and keep your knees under your hips. You may place padding under your knees for comfort. Let your head hang  down toward your chest. Contract your abdominal muscles and point your tailbone toward the floor so your lower back becomes rounded like the back of a cat. Hold this position for 5 seconds. Slowly lift your head, let your abdominal muscles relax, and point your tailbone up toward the ceiling so your back forms a sagging arch like the back of a cow. Hold this position for 5 seconds.  Press-ups Repeat these steps 5-10 times: Lie on your abdomen (face-down) on a firm bed or the floor. Place your palms near your head, about shoulder-width apart. Keeping your back as relaxed as possible and keeping your hips on the floor, slowly straighten your arms to raise the top half of your body and lift your shoulders. Do not use your back muscles to raise your upper torso. You may adjust the placement of your hands to make yourself more comfortable. Hold this position for 5 seconds while you keep your back relaxed. Slowly return to lying flat on the floor.  Bridges Repeat these steps 10 times: Lie on your back on a firm bed or the floor. Bend your knees so they are pointing toward the ceiling and your feet are flat on the floor. Your arms should be flat at your sides, next to your body. Tighten your buttocks muscles and lift your buttocks off the floor until your waist is at almost the same height as your knees. You should feel the muscles working in your buttocks and the back of your thighs. If you do not feel these muscles, slide your feet 1-2 inches (2.5-5 cm) farther away from your buttocks. Hold this position for 3-5 seconds. Slowly lower your hips to the starting position, and allow your buttocks muscles to relax completely. If this exercise is too easy, try doing it with your arms crossed over your chest. Abdominal crunches Repeat these steps 5-10 times: Lie on your back on a firm bed or the floor with your legs extended. Bend your knees so they are pointing toward the ceiling and your feet are flat  on the floor. Cross your arms over your chest. Tip your chin slightly toward your chest without bending your neck. Tighten your abdominal muscles and slowly raise your torso high enough to lift your shoulder blades a tiny bit off the floor. Avoid raising your torso higher than that because it can put too much stress on your lower back and does not help to strengthen your abdominal muscles. Slowly return to your starting position.  Back lifts Repeat these steps 5-10 times: Lie on your abdomen (face-down) with your arms at your sides, and rest your forehead on the floor. Tighten  the muscles in your legs and your buttocks. Slowly lift your chest off the floor while you keep your hips pressed to the floor. Keep the back of your head in line with the curve in your back. Your eyes should be looking at the floor. Hold this position for 3-5 seconds. Slowly return to your starting position.  Contact a health care provider if: Your back pain or discomfort gets much worse when you do an exercise. Your worsening back pain or discomfort does not lessen within 2 hours after you exercise. If you have any of these problems, stop doing these exercises right away. Do not do them again unless your health care provider says that you can. Get help right away if: You develop sudden, severe back pain. If this happens, stop doing the exercises right away. Do not do them again unless your health care provider says that you can. This information is not intended to replace advice given to you by your health care provider. Make sure you discuss any questions you have with your health care provider. Document Revised: 09/30/2020 Document Reviewed: 06/18/2020 Elsevier Patient Education  [place].

## 2022-05-24 DIAGNOSIS — M180 Bilateral primary osteoarthritis of first carpometacarpal joints: Secondary | ICD-10-CM | POA: Diagnosis not present

## 2022-06-14 ENCOUNTER — Ambulatory Visit: Payer: BC Managed Care – PPO | Admitting: Family Medicine

## 2022-06-14 ENCOUNTER — Encounter: Payer: Self-pay | Admitting: Family Medicine

## 2022-06-14 VITALS — BP 120/60 | HR 67 | Temp 98.2°F | Ht 66.0 in | Wt 148.9 lb

## 2022-06-14 DIAGNOSIS — Z13220 Encounter for screening for lipoid disorders: Secondary | ICD-10-CM | POA: Diagnosis not present

## 2022-06-14 DIAGNOSIS — Z87442 Personal history of urinary calculi: Secondary | ICD-10-CM

## 2022-06-14 NOTE — Progress Notes (Unsigned)
Established Patient Office Visit  Subjective   Patient ID: Jordan Khan, female    DOB: [birth year]  Age: 62 y.o. MRN: [id number]  Chief Complaint  Patient presents with   Establish Care    Patient is here for transition of care visit. She reports a history of kidney stones in the past. States that it is more like "sediment" where she is getting a nagging pain in the lower back or right groin area. States that she does get mild nausea with these episodes. States that she has seen [facility] in the past and had a work up. Had questions about flomax in the use of passing kidney stones.  Also had questions about the use of turmeric for arthritis pain as well.    Current Outpatient Medications  Medication Instructions   estradiol (ESTRACE) 0.1 MG/GM vaginal cream estradiol 0.01% (0.1 mg/gram) vaginal cream  INSERT 1 GRAM VAGINALLY AT BEDTIME FOR 14 DAYS, THEN USE 1/2 GRAM 2 TO 3 TIMES A WEEK   estradiol (ESTRACE) 0.5 mg, Oral, Weekly    Patient Active Problem List   Diagnosis Date Noted   History of kidney stones 06/15/2022   Ear pain, right 04/22/2012   Basal cell carcinoma 08/13/2010      Review of Systems  All other systems reviewed and are negative.     Objective:     BP 120/60 (BP Location: Right Arm, Patient Position: Sitting, Cuff Size: Normal)   Pulse 67   Temp 98.2 F (36.8 C) (Oral)   Ht '5\' 6"'$  (1.676 m)   Wt 148 lb 14.4 oz (67.5 kg)   SpO2 98%   BMI 24.03 kg/m    Physical Exam Vitals reviewed.  Constitutional:      Appearance: Normal appearance. She is well-groomed and normal weight.  Eyes:     Conjunctiva/sclera: Conjunctivae normal.  Cardiovascular:     Rate and Rhythm: Normal rate and regular rhythm.     Pulses: Normal pulses.     Heart sounds: S1 normal and S2 normal.  Pulmonary:     Effort: Pulmonary effort is normal.     Breath sounds: Normal breath sounds and air entry.  Abdominal:     General: Bowel sounds are normal.   Neurological:     Mental Status: She is alert and oriented to person, place, and time. Mental status is at baseline.     Gait: Gait is intact.  Psychiatric:        Mood and Affect: Mood and affect normal.        Speech: Speech normal.        Behavior: Behavior normal.        Judgment: Judgment normal.      No results found for any visits on 06/14/22.    The 10-year ASCVD risk score (Arnett DK, et al., 2019) is: 3%    Assessment & Plan:   Problem List Items Addressed This Visit       Unprioritized   History of kidney stones    Reviewed CT scan from 2012 which showed several small kidney stones on the right, she has had work up by urology already. I will ask for the records for review. I discussed that I'm not sure if turmeric would cause an increase in kidney stones as it is an herbal supplement and there is not much information on this.       Other Visit Diagnoses     Lipid screening    -  Primary  Relevant Orders   Lipid Panel     Pt is due for her annual lipid panel screening.   Return in about 1 year (around 06/15/2023) for annual physical exam.    Farrel Conners, MD

## 2022-06-15 DIAGNOSIS — Z87442 Personal history of urinary calculi: Secondary | ICD-10-CM | POA: Insufficient documentation

## 2022-06-15 NOTE — Assessment & Plan Note (Signed)
Reviewed CT scan from 2012 which showed several small kidney stones on the right, she has had work up by urology already. I will ask for the records for review. I discussed that I'm not sure if turmeric would cause an increase in kidney stones as it is an herbal supplement and there is not much information on this.

## 2022-06-20 DIAGNOSIS — H0014 Chalazion left upper eyelid: Secondary | ICD-10-CM | POA: Diagnosis not present

## 2022-06-22 DIAGNOSIS — H00014 Hordeolum externum left upper eyelid: Secondary | ICD-10-CM | POA: Diagnosis not present

## 2022-06-30 ENCOUNTER — Other Ambulatory Visit: Payer: BC Managed Care – PPO

## 2022-07-05 ENCOUNTER — Other Ambulatory Visit (INDEPENDENT_AMBULATORY_CARE_PROVIDER_SITE_OTHER): Payer: BC Managed Care – PPO

## 2022-07-05 DIAGNOSIS — Z13220 Encounter for screening for lipoid disorders: Secondary | ICD-10-CM | POA: Diagnosis not present

## 2022-07-05 LAB — LIPID PANEL
Cholesterol: 229 mg/dL — ABNORMAL HIGH (ref 0–200)
HDL: 95.7 mg/dL (ref >39.00)
LDL Cholesterol: 114 mg/dL — ABNORMAL HIGH (ref 0–99)
NonHDL: 132.92
Total CHOL/HDL Ratio: 2
Triglycerides: 96 mg/dL (ref 0.0–149.0)
VLDL: 19.2 mg/dL (ref 0.0–40.0)

## 2022-07-12 DIAGNOSIS — L570 Actinic keratosis: Secondary | ICD-10-CM | POA: Diagnosis not present

## 2022-07-12 DIAGNOSIS — D485 Neoplasm of uncertain behavior of skin: Secondary | ICD-10-CM | POA: Diagnosis not present

## 2022-07-12 DIAGNOSIS — B009 Herpesviral infection, unspecified: Secondary | ICD-10-CM | POA: Diagnosis not present

## 2022-07-26 ENCOUNTER — Encounter: Payer: Self-pay | Admitting: Podiatry

## 2022-07-26 ENCOUNTER — Ambulatory Visit (INDEPENDENT_AMBULATORY_CARE_PROVIDER_SITE_OTHER): Payer: BC Managed Care – PPO

## 2022-07-26 ENCOUNTER — Ambulatory Visit: Payer: BC Managed Care – PPO | Admitting: Podiatry

## 2022-07-26 DIAGNOSIS — M722 Plantar fascial fibromatosis: Secondary | ICD-10-CM

## 2022-07-26 DIAGNOSIS — M778 Other enthesopathies, not elsewhere classified: Secondary | ICD-10-CM

## 2022-07-26 MED ORDER — TRIAMCINOLONE ACETONIDE 40 MG/ML IJ SUSP
20.0000 mg | Freq: Once | INTRAMUSCULAR | Status: AC
  Administered 2022-07-26: 20 mg

## 2022-07-26 NOTE — Progress Notes (Signed)
She presents today for chief complaint of pain to the forefoot right.  She states that she has had aching and cramping times about several months has had a history of surgery which was a decompression all Jerrol Banana with capital osteotomy with screw fixation to the right first metatarsophalangeal joint.  She states that the arch sometimes hurts in this area.  Objective: Vital stable she is oriented x 3.  She has great range after the first metatarsophalangeal joint no reproducible pain on palpation of the arch.  She does have pain on palpation medial calcaneal tubercle of the right heel.  Radiographs firm joint space narrowing with a screw to the head of the first metatarsal of the right foot.  Otherwise she has soft tissue increase in density at the plantar fascia kidney insertion site.  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  Plan: Discussed etiology pathology and surgical therapies at this time discussed appropriate shoe gear stretching excise ice therapy and shoe gear modifications.  We also injected the right heel with 20 mg Kenalog 5 mg Marcaine to the point maximal tenderness.  She tolerated procedure well without complications.

## 2022-08-30 DIAGNOSIS — M25562 Pain in left knee: Secondary | ICD-10-CM | POA: Insufficient documentation

## 2022-09-08 ENCOUNTER — Telehealth: Payer: Self-pay | Admitting: *Deleted

## 2022-09-08 NOTE — Telephone Encounter (Signed)
Patient was noted on a list given to me by clinical supervisor for lack of recent mammogram results.  I called the patient and she stated she had a mammogram at her GYN's office-Dr Huel Cote and is aware a fax will be sent requesting results.

## 2022-09-09 ENCOUNTER — Encounter: Payer: Self-pay | Admitting: Family Medicine

## 2022-09-30 DIAGNOSIS — D225 Melanocytic nevi of trunk: Secondary | ICD-10-CM | POA: Diagnosis not present

## 2022-09-30 DIAGNOSIS — L821 Other seborrheic keratosis: Secondary | ICD-10-CM | POA: Diagnosis not present

## 2022-09-30 DIAGNOSIS — L570 Actinic keratosis: Secondary | ICD-10-CM | POA: Diagnosis not present

## 2022-09-30 DIAGNOSIS — L578 Other skin changes due to chronic exposure to nonionizing radiation: Secondary | ICD-10-CM | POA: Diagnosis not present

## 2022-12-07 DIAGNOSIS — D2272 Melanocytic nevi of left lower limb, including hip: Secondary | ICD-10-CM | POA: Diagnosis not present

## 2022-12-07 DIAGNOSIS — L570 Actinic keratosis: Secondary | ICD-10-CM | POA: Diagnosis not present

## 2022-12-07 DIAGNOSIS — C4401 Basal cell carcinoma of skin of lip: Secondary | ICD-10-CM | POA: Diagnosis not present

## 2022-12-07 DIAGNOSIS — D485 Neoplasm of uncertain behavior of skin: Secondary | ICD-10-CM | POA: Diagnosis not present

## 2022-12-16 DIAGNOSIS — Z01419 Encounter for gynecological examination (general) (routine) without abnormal findings: Secondary | ICD-10-CM | POA: Diagnosis not present

## 2022-12-16 DIAGNOSIS — Z130 Encounter for screening for diseases of the blood and blood-forming organs and certain disorders involving the immune mechanism: Secondary | ICD-10-CM | POA: Diagnosis not present

## 2022-12-16 DIAGNOSIS — Z1231 Encounter for screening mammogram for malignant neoplasm of breast: Secondary | ICD-10-CM | POA: Diagnosis not present

## 2022-12-18 ENCOUNTER — Other Ambulatory Visit: Payer: Self-pay | Admitting: Obstetrics and Gynecology

## 2022-12-18 DIAGNOSIS — Z1231 Encounter for screening mammogram for malignant neoplasm of breast: Secondary | ICD-10-CM

## 2023-01-12 DIAGNOSIS — C4401 Basal cell carcinoma of skin of lip: Secondary | ICD-10-CM | POA: Diagnosis not present

## 2023-02-16 ENCOUNTER — Encounter: Payer: Self-pay | Admitting: Family Medicine

## 2023-02-16 ENCOUNTER — Ambulatory Visit: Payer: BC Managed Care – PPO | Admitting: Family Medicine

## 2023-02-16 VITALS — BP 108/70 | HR 65 | Temp 98.0°F | Ht 66.0 in | Wt 153.6 lb

## 2023-02-16 DIAGNOSIS — B029 Zoster without complications: Secondary | ICD-10-CM

## 2023-02-16 MED ORDER — VALACYCLOVIR HCL 1 G PO TABS: 1000.0000 mg | ORAL_TABLET | Freq: Three times a day (TID) | ORAL | 0 refills | Status: AC

## 2023-02-16 NOTE — Progress Notes (Signed)
Established Patient Office Visit   Subjective  Patient ID: Jordan Khan, female    DOB: [birth year]  Age: 62 y.o. MRN: [id number]  Chief Complaint  Patient presents with   Rash    Patient came in today for a rash on her back, started 3 days ago, possible shingles, itchy, and sore     Patient is a 62 year old female followed by Dr. Casimiro Needle seen for acute concern.  Patient with rash on mid left back x 3 days.  Patient states bumps started as blisters then became dry in appearance.  Slightly pruritic with a burning like sensation.  Patient had shingles vaccines.  Denies any obvious stress though the upcoming elections may be causing some.  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  Rash    Patient Active Problem List   Diagnosis Date Noted   History of kidney stones 06/15/2022   Ear pain, right 04/22/2012   Basal cell carcinoma 08/13/2010   Past Medical History:  Diagnosis Date   Arthritis    Basal cell carcinoma 08/13/2010   sees dermatologist   Genital warts    Hot flashes    and vaginal atrophy - sees Dr. Senaida Ores   IBS (irritable bowel syndrome)    Kidney stones    Right ear pain    Rotator cuff syndrome of left shoulder 08/13/2010   Past Surgical History:  Procedure Laterality Date   ABDOMINAL HYSTERECTOMY  2002   done for endometriosis. one ovary remains   APPENDECTOMY  2002   removed at time of hysterctomy   BASAL CELL CARCINOMA EXCISION  2010   FOOT SURGERY  2018   right foot   WISDOM TOOTH EXTRACTION     Social History   Tobacco Use   Smoking status: Never    Passive exposure: Never   Smokeless tobacco: Never  Vaping Use   Vaping status: Never Used  Substance Use Topics   Alcohol use: Yes    Comment: 1-2 glasses of wine once per week   Drug use: No   Family History  Problem Relation Age of Onset   Hypertension Mother    Dementia Mother 78   Stroke Mother 41   Arthritis Father    Hyperlipidemia Father 56        Deceased   Heart disease Father 2       MI   Dementia Father    Hyperlipidemia Sister    Hypertension Sister    Hypertension Sister    Diverticulitis Sister    Dementia Sister 51       early onset   Asthma Sister    Diverticulitis Sister    Arthritis Sister        x1   Diverticulitis Sister    Arthritis Sister        neck surgery   Stroke Maternal Grandmother 45   Alcohol abuse Maternal Grandfather    Stroke Paternal Grandmother 26   Heart disease Paternal Grandfather    Healthy Son    Healthy Son    Healthy Daughter    No Known Allergies    Review of Systems  Skin:  Positive for rash.   Negative unless stated above    Objective:     BP 108/70 (BP Location: Right Arm, Patient Position: Sitting, Cuff Size: Normal)   Pulse 65   Temp 98 F (36.7 C) (Oral)   Ht 5\' 6"  (1.676 m)   Wt 153 lb 9.6 oz (  69.7 kg)   SpO2 97%   BMI 24.79 kg/m  BP Readings from Last 3 Encounters:  02/16/23 108/70  06/14/22 120/60  04/22/22 120/75   Wt Readings from Last 3 Encounters:  02/16/23 153 lb 9.6 oz (69.7 kg)  06/14/22 148 lb 14.4 oz (67.5 kg)  04/22/22 153 lb 3.2 oz (69.5 kg)      Physical Exam Constitutional:      Appearance: Normal appearance.  HENT:     Head: Normocephalic and atraumatic.     Nose: Nose normal.     Mouth/Throat:     Mouth: Mucous membranes are dry.  Eyes:     Extraocular Movements: Extraocular movements intact.     Conjunctiva/sclera: Conjunctivae normal.     Pupils: Pupils are equal, round, and reactive to light.  Cardiovascular:     Rate and Rhythm: Normal rate.  Pulmonary:     Effort: Pulmonary effort is normal.  Skin:    General: Skin is warm and dry.          Comments: 3 dried appearing lesions with surrounding erythema, 3-4 mm in diameter on left mid lateral, right, and left breast.  No vesicles noted.  Neurological:     Mental Status: She is alert and oriented to person, place, and time.      No results found for any visits  on 02/16/23.    Assessment & Plan:  Herpes zoster without complication -     valACYclovir HCl; Take 1 tablet (1,000 mg total) by mouth 3 (three) times daily for 7 days.  Dispense: 21 tablet; Refill: 0  Patient with acute dried lesions on left back concerning for possible shingles outbreak.  Patient to monitor for new vesicles.  Start Valtrex 1000 mg 3 times daily x 7 days.  Discussed other supportive care.  Return if symptoms worsen or fail to improve.   Deeann Saint, MD

## 2023-02-17 ENCOUNTER — Telehealth: Payer: Self-pay | Admitting: Family Medicine

## 2023-02-17 NOTE — Telephone Encounter (Signed)
Pt is calling and saw dr banks yesterday and medication valACYclovir (VALTREX) 1000 MG tablet is causing headaches and nausea and pt would like another med [id number] - [place], [place] - [address] AT [facility] [address] Phone: [phone]  Fax: [phone]

## 2023-02-21 NOTE — Telephone Encounter (Signed)
The only alternative is acyclovir but it still could cause the same side effects. We could treat her nausea with zofran to help her tolerate the medication if she would like to do this instead

## 2023-02-21 NOTE — Telephone Encounter (Signed)
Spoke with patient, she has stopped using medication and is feeling better at this time

## 2023-03-23 DIAGNOSIS — I781 Nevus, non-neoplastic: Secondary | ICD-10-CM | POA: Diagnosis not present

## 2023-03-23 DIAGNOSIS — L821 Other seborrheic keratosis: Secondary | ICD-10-CM | POA: Diagnosis not present

## 2023-03-23 DIAGNOSIS — L578 Other skin changes due to chronic exposure to nonionizing radiation: Secondary | ICD-10-CM | POA: Diagnosis not present

## 2023-03-23 DIAGNOSIS — L570 Actinic keratosis: Secondary | ICD-10-CM | POA: Diagnosis not present

## 2023-03-23 DIAGNOSIS — D225 Melanocytic nevi of trunk: Secondary | ICD-10-CM | POA: Diagnosis not present

## 2023-03-29 ENCOUNTER — Other Ambulatory Visit: Payer: Self-pay | Admitting: Obstetrics and Gynecology

## 2023-03-29 DIAGNOSIS — E2839 Other primary ovarian failure: Secondary | ICD-10-CM

## 2023-04-11 NOTE — Progress Notes (Deleted)
 Office Visit Note  Patient: Jordan Khan             Date of Birth: [birth year]           MRN: [id number]             PCP: Karie Georges, MD Referring: No ref. provider found Visit Date: 04/25/2023 Occupation: @GUAROCC @  Subjective:  No chief complaint on file.   History of Present Illness: Jordan Khan is a 62 y.o. female ***returns today after her last visit on April 22, 2022.  Patient was initially evaluated for positive ANA and osteoarthritis.    Activities of Daily Living:  Patient reports morning stiffness for *** {minute/hour:19697}.   Patient {ACTIONS;DENIES/REPORTS:21021675::"Denies"} nocturnal pain.  Difficulty dressing/grooming: {ACTIONS;DENIES/REPORTS:21021675::"Denies"} Difficulty climbing stairs: {ACTIONS;DENIES/REPORTS:21021675::"Denies"} Difficulty getting out of chair: {ACTIONS;DENIES/REPORTS:21021675::"Denies"} Difficulty using hands for taps, buttons, cutlery, and/or writing: {ACTIONS;DENIES/REPORTS:21021675::"Denies"}  No Rheumatology ROS completed.   PMFS History:  Patient Active Problem List   Diagnosis Date Noted   History of kidney stones 06/15/2022   Ear pain, right 04/22/2012   Basal cell carcinoma 08/13/2010    Past Medical History:  Diagnosis Date   Arthritis    Basal cell carcinoma 08/13/2010   sees dermatologist   Genital warts    Hot flashes    and vaginal atrophy - sees Dr. Senaida Ores   IBS (irritable bowel syndrome)    Kidney stones    Right ear pain    Rotator cuff syndrome of left shoulder 08/13/2010    Family History  Problem Relation Age of Onset   Hypertension Mother    Dementia Mother 73   Stroke Mother 54   Arthritis Father    Hyperlipidemia Father 64       Deceased   Heart disease Father 32       MI   Dementia Father    Hyperlipidemia Sister    Hypertension Sister    Hypertension Sister    Diverticulitis Sister    Dementia Sister 64       early onset   Asthma Sister    Diverticulitis Sister     Arthritis Sister        x1   Diverticulitis Sister    Arthritis Sister        neck surgery   Stroke Maternal Grandmother 53   Alcohol abuse Maternal Grandfather    Stroke Paternal Grandmother 67   Heart disease Paternal Grandfather    Healthy Son    Healthy Son    Healthy Daughter    Past Surgical History:  Procedure Laterality Date   ABDOMINAL HYSTERECTOMY  2002   done for endometriosis. one ovary remains   APPENDECTOMY  2002   removed at time of hysterctomy   BASAL CELL CARCINOMA EXCISION  2010   FOOT SURGERY  2018   right foot   WISDOM TOOTH EXTRACTION     Social History   Social History Narrative   Work or School: works part time preschool      Home Situation: lives with husband and son is in college in 2016      Spiritual Beliefs: Christian      Lifestyle: walking and yoga; diet is healthy      Immunization History  Administered Date(s) Administered   Influenza,inj,Quad PF,6+ Mos 01/31/2019   Influenza-Unspecified 12/18/2017   Moderna Sars-Covid-2 Vaccination 02/29/2020   PFIZER(Purple Top)SARS-COV-2 Vaccination 08/05/2020   Tdap 08/13/2010, 12/12/2020   Zoster Recombinant(Shingrix) 09/27/2017, 11/29/2017     Objective: Vital Signs: There were  no vitals taken for this visit.   Physical Exam   Musculoskeletal Exam: ***  CDAI Exam: CDAI Score: -- Patient Global: --; Provider Global: -- Swollen: --; Tender: -- Joint Exam 04/25/2023   No joint exam has been documented for this visit   There is currently no information documented on the homunculus. Go to the Rheumatology activity and complete the homunculus joint exam.  Investigation: No additional findings.  Imaging: No results found.  Recent Labs: Lab Results  Component Value Date   WBC 7.0 10/09/2021   HGB 12.0 10/09/2021   PLT 294.0 10/09/2021   NA 139 10/09/2021   K 4.1 10/09/2021   CL 104 10/09/2021   CO2 27 10/09/2021   GLUCOSE 110 (H) 10/09/2021   BUN 15 10/09/2021   CREATININE  0.70 10/09/2021   BILITOT 0.3 10/09/2021   ALKPHOS 57 12/19/2020   AST 24 10/09/2021   ALT 19 10/09/2021   PROT 6.1 10/09/2021   ALBUMIN 3.9 12/19/2020   CALCIUM 8.9 10/09/2021   GFRAA  09/12/2010    >60        The eGFR has been calculated using the MDRD equation. This calculation has not been validated in all clinical situations. eGFR's persistently <60 mL/min signify possible Chronic Kidney Disease.   10/09/21: ANA 1:320NH, 1:320NS, CRP<1, uric acid 3.6, ESR 8, RF<14, anti-CCP<16, 14-3-3 eta<0.2, Centromere ab>8, ENA panel including double-stranded DNA, Smith, SSA, SSB, SCL 70 RNP were all within normal limits   Speciality Comments: No specialty comments available.  Procedures:  No procedures performed Allergies: Patient has no known allergies.   Assessment / Plan:     Visit Diagnoses: No diagnosis found.  Orders: No orders of the defined types were placed in this encounter.  No orders of the defined types were placed in this encounter.   Face-to-face time spent with patient was *** minutes. Greater than 50% of time was spent in counseling and coordination of care.  Follow-Up Instructions: No follow-ups on file.   Pollyann Savoy, MD  Note - This record has been created using Animal nutritionist.  Chart creation errors have been sought, but may not always  have been located. Such creation errors do not reflect on  the standard of medical care.

## 2023-04-25 ENCOUNTER — Ambulatory Visit: Payer: BC Managed Care – PPO | Admitting: Rheumatology

## 2023-04-25 DIAGNOSIS — Z85828 Personal history of other malignant neoplasm of skin: Secondary | ICD-10-CM

## 2023-04-25 DIAGNOSIS — M545 Low back pain, unspecified: Secondary | ICD-10-CM

## 2023-04-25 DIAGNOSIS — Q6671 Congenital pes cavus, right foot: Secondary | ICD-10-CM

## 2023-04-25 DIAGNOSIS — M19042 Primary osteoarthritis, left hand: Secondary | ICD-10-CM

## 2023-04-25 DIAGNOSIS — M19071 Primary osteoarthritis, right ankle and foot: Secondary | ICD-10-CM

## 2023-04-25 DIAGNOSIS — N200 Calculus of kidney: Secondary | ICD-10-CM

## 2023-04-25 DIAGNOSIS — R768 Other specified abnormal immunological findings in serum: Secondary | ICD-10-CM

## 2023-04-27 ENCOUNTER — Ambulatory Visit: Payer: BC Managed Care – PPO | Admitting: Adult Health

## 2023-04-27 ENCOUNTER — Encounter: Payer: Self-pay | Admitting: Adult Health

## 2023-04-27 VITALS — BP 110/60 | HR 57 | Temp 97.9°F | Ht 66.0 in | Wt 149.0 lb

## 2023-04-27 DIAGNOSIS — H93A3 Pulsatile tinnitus, bilateral: Secondary | ICD-10-CM | POA: Diagnosis not present

## 2023-04-27 MED ORDER — FLUTICASONE PROPIONATE 50 MCG/ACT NA SUSP: 2.0000 | Freq: Every day | NASAL | 0 refills | Status: DC

## 2023-04-27 NOTE — Progress Notes (Signed)
 Subjective:    Patient ID: Jordan Khan, female    DOB: 1960/07/14, 63 y.o.   MRN: [id number]  HPI 63 year old female who  has a past medical history of Arthritis, Basal cell carcinoma (08/13/2010), Genital warts, Hot flashes, IBS (irritable bowel syndrome), Kidney stones, Right ear pain, and Rotator cuff syndrome of left shoulder (08/13/2010).  She is a patient of Dr. Ozell who I am seeing today today for an acute issue. She reports that she has noticed about a month ago  that when she is laying on her side she will hear a  pulse in the ear on the side she is laying on.  This will keep her up at night. When laying on her back she will not hear the pulse. She also endorses periodic whistling sound in her ears as well.   She denies pain, loss hearing, sinus pain or pressure, or fevers/chills..  Review of Systems See HPI   Past Medical History:  Diagnosis Date   Arthritis    Basal cell carcinoma 08/13/2010   sees dermatologist   Genital warts    Hot flashes    and vaginal atrophy - sees Dr. Estelle   IBS (irritable bowel syndrome)    Kidney stones    Right ear pain    Rotator cuff syndrome of left shoulder 08/13/2010    Social History   Socioeconomic History   Marital status: Married    Spouse name: Not on file   Number of children: Not on file   Years of education: Not on file   Highest education level: Not on file  Occupational History   Not on file  Tobacco Use   Smoking status: Never    Passive exposure: Never   Smokeless tobacco: Never  Vaping Use   Vaping status: Never Used  Substance and Sexual Activity   Alcohol use: Yes    Comment: 1-2 glasses of wine once per week   Drug use: No   Sexual activity: Not on file  Other Topics Concern   Not on file  Social History Narrative   Work or School: works part time preschool      Home Situation: lives with husband and son is in college in 2016      Spiritual Beliefs: Christian      Lifestyle: walking  and yoga; diet is healthy      Social Drivers of Corporate Investment Banker Strain: Not on file  Food Insecurity: Not on file  Transportation Needs: Not on file  Physical Activity: Not on file  Stress: Not on file  Social Connections: Not on file  Intimate Partner Violence: Not on file    Past Surgical History:  Procedure Laterality Date   ABDOMINAL HYSTERECTOMY  2002   done for endometriosis. one ovary remains   APPENDECTOMY  2002   removed at time of hysterctomy   BASAL CELL CARCINOMA EXCISION  2010   FOOT SURGERY  2018   right foot   WISDOM TOOTH EXTRACTION      Family History  Problem Relation Age of Onset   Hypertension Mother    Dementia Mother 32   Stroke Mother 63   Arthritis Father    Hyperlipidemia Father 41       Deceased   Heart disease Father 67       MI   Dementia Father    Hyperlipidemia Sister    Hypertension Sister    Hypertension Sister    Diverticulitis Sister  Dementia Sister 40       early onset   Asthma Sister    Diverticulitis Sister    Arthritis Sister        x1   Diverticulitis Sister    Arthritis Sister        neck surgery   Stroke Maternal Grandmother 32   Alcohol abuse Maternal Grandfather    Stroke Paternal Grandmother 45   Heart disease Paternal Grandfather    Healthy Son    Healthy Son    Healthy Daughter     No Known Allergies  Current Outpatient Medications on File Prior to Visit  Medication Sig Dispense Refill   estradiol (ESTRACE) 0.1 MG/GM vaginal cream estradiol 0.01% (0.1 mg/gram) vaginal cream  INSERT 1 GRAM VAGINALLY AT BEDTIME FOR 14 DAYS, THEN USE 1/2 GRAM 2 TO 3 TIMES A WEEK     estradiol (ESTRACE) 0.5 MG tablet Take 0.5 mg by mouth once a week.   11   meloxicam  (MOBIC ) 15 MG tablet TAKE 1 TABLET BY MOUTH DAILY WITH FOOD FOR 5-7 DAYS, THEN AS NEEDED     No current facility-administered medications on file prior to visit.    BP 110/60   Pulse (!) 57   Temp 97.9 F (36.6 C) (Oral)   Ht 5' 6 (1.676  m)   Wt 149 lb (67.6 kg)   SpO2 98%   BMI 24.05 kg/m       Objective:   Physical Exam Vitals and nursing note reviewed.  Constitutional:      Appearance: Normal appearance.  HENT:     Right Ear: A middle ear effusion is present. There is no impacted cerumen. Tympanic membrane is not erythematous or bulging.     Left Ear: A middle ear effusion is present. There is no impacted cerumen. Tympanic membrane is not erythematous or bulging.  Neck:     Vascular: No carotid bruit.  Cardiovascular:     Rate and Rhythm: Normal rate and regular rhythm.     Heart sounds: Normal heart sounds.  Pulmonary:     Breath sounds: Normal breath sounds.  Musculoskeletal:        General: Normal range of motion.  Skin:    General: Skin is warm and dry.  Neurological:     General: No focal deficit present.     Mental Status: She is alert and oriented to person, place, and time.  Psychiatric:        Mood and Affect: Mood normal.        Behavior: Behavior normal.        Thought Content: Thought content normal.        Judgment: Judgment normal.       Assessment & Plan:   1. Pulsatile tinnitus of both ears (Primary) - She did have some mild fluid behind her TM but no signs of infection. Likely pulsating come from ETD but will get Carotid US  to r/o blockage in carotid arteries. In the meantime will have her start using Flonase  and can also use Claritin  - US  Carotid Duplex Bilateral; Future - fluticasone  (FLONASE ) 50 MCG/ACT nasal spray; Place 2 sprays into both nostrils daily.  Dispense: 16 g; Refill: 0   Darleene Shape, NP  Time spent with patient today was 31 minutes which consisted of chart review, discussing ETD and tinnitus, work up, treatment answering questions and documentation.

## 2023-05-11 ENCOUNTER — Other Ambulatory Visit: Payer: Self-pay | Admitting: Adult Health

## 2023-05-11 DIAGNOSIS — H93A3 Pulsatile tinnitus, bilateral: Secondary | ICD-10-CM

## 2023-05-11 NOTE — Telephone Encounter (Signed)
Ok to refill but she will be due for her Annual physical in February-- please have patient schedule this appointment.

## 2023-05-12 ENCOUNTER — Encounter: Payer: Self-pay | Admitting: Adult Health

## 2023-05-12 ENCOUNTER — Other Ambulatory Visit: Payer: BC Managed Care – PPO

## 2023-05-26 ENCOUNTER — Ambulatory Visit
Admission: RE | Admit: 2023-05-26 | Discharge: 2023-05-26 | Disposition: A | Discharge status: Home / Self Care | Payer: BC Managed Care – PPO | Source: Ambulatory Visit | Attending: Adult Health | Admitting: Adult Health

## 2023-05-26 DIAGNOSIS — H93A3 Pulsatile tinnitus, bilateral: Secondary | ICD-10-CM

## 2023-05-26 DIAGNOSIS — H93A9 Pulsatile tinnitus, unspecified ear: Secondary | ICD-10-CM | POA: Diagnosis not present

## 2023-05-28 ENCOUNTER — Encounter: Payer: Self-pay | Admitting: Adult Health

## 2023-05-28 DIAGNOSIS — H93A9 Pulsatile tinnitus, unspecified ear: Secondary | ICD-10-CM

## 2023-05-31 ENCOUNTER — Encounter: Payer: Self-pay | Admitting: Podiatry

## 2023-05-31 ENCOUNTER — Encounter: Payer: Self-pay | Admitting: Adult Health

## 2023-05-31 ENCOUNTER — Ambulatory Visit (INDEPENDENT_AMBULATORY_CARE_PROVIDER_SITE_OTHER): Payer: BC Managed Care – PPO

## 2023-05-31 ENCOUNTER — Ambulatory Visit: Payer: BC Managed Care – PPO | Admitting: Podiatry

## 2023-05-31 DIAGNOSIS — M7751 Other enthesopathy of right foot: Secondary | ICD-10-CM

## 2023-05-31 MED ORDER — DEXAMETHASONE SODIUM PHOSPHATE 120 MG/30ML IJ SOLN
2.0000 mg | Freq: Once | INTRAMUSCULAR | Status: AC
  Administered 2023-05-31: 2 mg via INTRA_ARTICULAR

## 2023-05-31 NOTE — Telephone Encounter (Signed)
Please advise

## 2023-06-01 NOTE — Progress Notes (Signed)
She presents today after having not seen her for several months with a chief concern of pain and it feels like something is rolling or popping underneath the big toe joint.  She states that she is noticed that over the past couple of months when she has been walking.  She states that it gets uncomfortable at times.  Objective: Vital signs are stable she is alert and oriented x 3 there is no erythema edema cellulitis drainage odor she has a palpable nerve or bursa just lateral and distal to her fibular sesamoid that is clicking over the edge of the sesamoid it does not feel that it is the distal slip of the fibular sesamoid or the fibular slip of the flexor brevis.  Radiographs do not demonstrate any type of osseous abnormality in this area.  Assessment bursitis capsulitis cannot rule out sesamoiditis or neuritis.  Plan: Injected the area today with 10 mg of Kenalog and local anesthetic.  And I will follow-up with her on an as-needed basis discussed appropriate shoe gear.

## 2023-06-02 ENCOUNTER — Other Ambulatory Visit: Payer: Self-pay | Admitting: Family Medicine

## 2023-06-02 DIAGNOSIS — H93A3 Pulsatile tinnitus, bilateral: Secondary | ICD-10-CM

## 2023-06-03 NOTE — Telephone Encounter (Signed)
Ok to place referral to ENT for the pulsatile tinnitis, I don't have any recommendation, just first availabel.

## 2023-07-25 DIAGNOSIS — H93A3 Pulsatile tinnitus, bilateral: Secondary | ICD-10-CM | POA: Diagnosis not present

## 2023-08-18 ENCOUNTER — Institutional Professional Consult (permissible substitution) (INDEPENDENT_AMBULATORY_CARE_PROVIDER_SITE_OTHER)

## 2023-08-18 ENCOUNTER — Ambulatory Visit (INDEPENDENT_AMBULATORY_CARE_PROVIDER_SITE_OTHER): Admitting: Audiology

## 2023-09-09 ENCOUNTER — Telehealth: Payer: Self-pay

## 2023-09-09 DIAGNOSIS — Z136 Encounter for screening for cardiovascular disorders: Secondary | ICD-10-CM

## 2023-09-09 NOTE — Telephone Encounter (Signed)
 Copied from CRM [id number]. Topic: Referral - Question >> Sep 09, 2023  2:32 PM Aisha D wrote: Reason for CRM: Pt stated that she would like to get a referral to have a calcium scan done. Pt stated that she discussed this with Dr.Michael and wants to move forward with it. Pt would like for Dr.Michael to send the referral and give her a call once it is completed.

## 2023-09-10 NOTE — Telephone Encounter (Signed)
 Order placed

## 2023-09-10 NOTE — Addendum Note (Signed)
 Addended by: Jada Fass M on: 09/10/2023 04:34 PM   Modules accepted: Orders

## 2023-09-13 NOTE — Telephone Encounter (Signed)
 Spoke with the patient, informed her the order was placed as below and someone will contact her with appt information.

## 2023-09-29 ENCOUNTER — Ambulatory Visit (HOSPITAL_BASED_OUTPATIENT_CLINIC_OR_DEPARTMENT_OTHER)
Admission: RE | Admit: 2023-09-29 | Discharge: 2023-09-29 | Disposition: A | Discharge status: Home / Self Care | Payer: Self-pay | Source: Ambulatory Visit | Attending: Family Medicine | Admitting: Family Medicine

## 2023-09-29 DIAGNOSIS — Z136 Encounter for screening for cardiovascular disorders: Secondary | ICD-10-CM | POA: Insufficient documentation

## 2023-09-30 ENCOUNTER — Ambulatory Visit: Payer: Self-pay | Admitting: Family Medicine

## 2023-11-10 DIAGNOSIS — L570 Actinic keratosis: Secondary | ICD-10-CM | POA: Diagnosis not present

## 2023-11-10 DIAGNOSIS — D225 Melanocytic nevi of trunk: Secondary | ICD-10-CM | POA: Diagnosis not present

## 2023-11-10 DIAGNOSIS — C44319 Basal cell carcinoma of skin of other parts of face: Secondary | ICD-10-CM | POA: Diagnosis not present

## 2023-11-10 DIAGNOSIS — D485 Neoplasm of uncertain behavior of skin: Secondary | ICD-10-CM | POA: Diagnosis not present

## 2023-11-10 DIAGNOSIS — L578 Other skin changes due to chronic exposure to nonionizing radiation: Secondary | ICD-10-CM | POA: Diagnosis not present

## 2023-11-10 DIAGNOSIS — L821 Other seborrheic keratosis: Secondary | ICD-10-CM | POA: Diagnosis not present

## 2023-11-15 ENCOUNTER — Ambulatory Visit (HOSPITAL_BASED_OUTPATIENT_CLINIC_OR_DEPARTMENT_OTHER)
Admission: RE | Admit: 2023-11-15 | Discharge: 2023-11-15 | Disposition: A | Discharge status: Home / Self Care | Source: Ambulatory Visit | Attending: Obstetrics and Gynecology | Admitting: Obstetrics and Gynecology

## 2023-11-15 DIAGNOSIS — E2839 Other primary ovarian failure: Secondary | ICD-10-CM | POA: Diagnosis not present

## 2023-11-15 DIAGNOSIS — M8589 Other specified disorders of bone density and structure, multiple sites: Secondary | ICD-10-CM | POA: Diagnosis not present

## 2023-11-15 DIAGNOSIS — Z780 Asymptomatic menopausal state: Secondary | ICD-10-CM | POA: Diagnosis not present

## 2023-11-21 ENCOUNTER — Other Ambulatory Visit: Payer: BC Managed Care – PPO

## 2023-12-06 ENCOUNTER — Ambulatory Visit (INDEPENDENT_AMBULATORY_CARE_PROVIDER_SITE_OTHER): Admitting: Podiatry

## 2023-12-06 ENCOUNTER — Ambulatory Visit (INDEPENDENT_AMBULATORY_CARE_PROVIDER_SITE_OTHER)

## 2023-12-06 ENCOUNTER — Encounter: Payer: Self-pay | Admitting: Podiatry

## 2023-12-06 DIAGNOSIS — D2330 Other benign neoplasm of skin of unspecified part of face: Secondary | ICD-10-CM | POA: Insufficient documentation

## 2023-12-06 DIAGNOSIS — M19071 Primary osteoarthritis, right ankle and foot: Secondary | ICD-10-CM | POA: Diagnosis not present

## 2023-12-06 DIAGNOSIS — L821 Other seborrheic keratosis: Secondary | ICD-10-CM | POA: Insufficient documentation

## 2023-12-06 DIAGNOSIS — C4401 Basal cell carcinoma of skin of lip: Secondary | ICD-10-CM | POA: Insufficient documentation

## 2023-12-06 DIAGNOSIS — B3731 Acute candidiasis of vulva and vagina: Secondary | ICD-10-CM | POA: Insufficient documentation

## 2023-12-06 DIAGNOSIS — Z87898 Personal history of other specified conditions: Secondary | ICD-10-CM | POA: Insufficient documentation

## 2023-12-06 DIAGNOSIS — N952 Postmenopausal atrophic vaginitis: Secondary | ICD-10-CM | POA: Insufficient documentation

## 2023-12-06 DIAGNOSIS — N951 Menopausal and female climacteric states: Secondary | ICD-10-CM | POA: Insufficient documentation

## 2023-12-06 DIAGNOSIS — N9089 Other specified noninflammatory disorders of vulva and perineum: Secondary | ICD-10-CM | POA: Insufficient documentation

## 2023-12-06 DIAGNOSIS — D2370 Other benign neoplasm of skin of unspecified lower limb, including hip: Secondary | ICD-10-CM | POA: Insufficient documentation

## 2023-12-06 DIAGNOSIS — D225 Melanocytic nevi of trunk: Secondary | ICD-10-CM | POA: Insufficient documentation

## 2023-12-06 DIAGNOSIS — Z85828 Personal history of other malignant neoplasm of skin: Secondary | ICD-10-CM | POA: Insufficient documentation

## 2023-12-06 DIAGNOSIS — L814 Other melanin hyperpigmentation: Secondary | ICD-10-CM | POA: Insufficient documentation

## 2023-12-06 DIAGNOSIS — Z808 Family history of malignant neoplasm of other organs or systems: Secondary | ICD-10-CM | POA: Insufficient documentation

## 2023-12-06 NOTE — Progress Notes (Signed)
 She presents today with a chief concern of intermittent dorsal pain overlying the tarsometatarsal joints that she points to them states that the last 1 to 2 minutes that usually after long hikes really but that he has been even bother her while she is playing pickle ball or any other activities.  She has got a vacation coming up soon and she just does not want to bother her during that time.  Objective: Vitals are stable oriented x 3 pulses are palpable.  She has osteoarthritic change of the first metatarsophalangeal joint sesamoidal apparatus of that right foot from her previous surgery but she also has nonreproducible pain overlying the tarsometatarsal joints with frontal plane range of motion and palpation.  Tendons are intact.  Radiographs taken today do demonstrate osteoarthritic change overlying that 1st and 2nd tarsometatarsal joint of the right foot.  Dorsal spurring subchondral sclerosis and eburnation is noted.  Assessment: Osteoarthritis dorsal aspect right foot.  Plan: Discussed topical therapy oral therapy as well as injectable therapy.  At this point she is going to try 7.5 mg of meloxicam  which she already has at home and she can double up on that if she needs to.  Also we discussed lacing techniques for the dorsal aspect of the foot as well as inserts.  I will follow-up with her in the near future if necessary.

## 2023-12-07 DIAGNOSIS — C44319 Basal cell carcinoma of skin of other parts of face: Secondary | ICD-10-CM | POA: Diagnosis not present

## 2023-12-26 ENCOUNTER — Telehealth: Payer: Self-pay | Admitting: *Deleted

## 2023-12-26 NOTE — Telephone Encounter (Unsigned)
 Copied from CRM #[id number]. Topic: Clinical - Medication Question >> Dec 23, 2023  4:08 PM Lauren C wrote: Reason for CRM: Pt requesting covid vaccine prescription sent to [id number] - [place], [place] - [address] AT [address] OF [address] [place] [address] Phone: [phone] Fax: [phone] Hours: Not open 24 hours

## 2023-12-27 MED ORDER — COVID-19 MRNA VACC (MODERNA) 50 MCG/0.5ML IM SUSP: 0.5000 mL | Freq: Once | INTRAMUSCULAR | 0 refills | Status: AC

## 2023-12-27 NOTE — Telephone Encounter (Signed)
 Left a detailed message on the voicemail stating the order was sent to [facility] as requested for her and husband also.

## 2024-01-31 DIAGNOSIS — N952 Postmenopausal atrophic vaginitis: Secondary | ICD-10-CM | POA: Diagnosis not present

## 2024-01-31 DIAGNOSIS — Z1231 Encounter for screening mammogram for malignant neoplasm of breast: Secondary | ICD-10-CM | POA: Diagnosis not present

## 2024-01-31 DIAGNOSIS — Z01419 Encounter for gynecological examination (general) (routine) without abnormal findings: Secondary | ICD-10-CM | POA: Diagnosis not present

## 2024-01-31 DIAGNOSIS — N951 Menopausal and female climacteric states: Secondary | ICD-10-CM | POA: Diagnosis not present

## 2024-02-14 ENCOUNTER — Encounter: Payer: Self-pay | Admitting: Internal Medicine

## 2024-02-14 ENCOUNTER — Ambulatory Visit: Admitting: Internal Medicine

## 2024-02-14 VITALS — BP 118/80 | HR 79 | Temp 98.2°F | Wt 153.6 lb

## 2024-02-14 DIAGNOSIS — J302 Other seasonal allergic rhinitis: Secondary | ICD-10-CM | POA: Diagnosis not present

## 2024-02-14 NOTE — Progress Notes (Signed)
 Established Patient Office Visit     CC/Reason for Visit: Postnasal drip, cough  HPI: Jordan Khan is a 63 y.o. female who is coming in today for the above mentioned reasons.  Has been having the symptoms for about 6 weeks.  Cough is mainly nonproductive.  Significant postnasal drip.  No other URI symptoms like nasal or sinus congestion.   Past Medical/Surgical History: Past Medical History:  Diagnosis Date   Arthritis    Basal cell carcinoma 08/13/2010   sees dermatologist   Genital warts    Hot flashes    and vaginal atrophy - sees Dr. Estelle   IBS (irritable bowel syndrome)    Kidney stones    Right ear pain    Rotator cuff syndrome of left shoulder 08/13/2010    Past Surgical History:  Procedure Laterality Date   ABDOMINAL HYSTERECTOMY  2002   done for endometriosis. one ovary remains   APPENDECTOMY  2002   removed at time of hysterctomy   BASAL CELL CARCINOMA EXCISION  2010   FOOT SURGERY  2018   right foot   WISDOM TOOTH EXTRACTION      Social History:  reports that she has never smoked. She has never been exposed to tobacco smoke. She has never used smokeless tobacco. She reports current alcohol use. She reports that she does not use drugs.  Allergies: No Known Allergies  Family History:  Family History  Problem Relation Age of Onset   Hypertension Mother    Dementia Mother 37   Stroke Mother 59   Arthritis Father    Hyperlipidemia Father 38       Deceased   Heart disease Father 61       MI   Dementia Father    Hyperlipidemia Sister    Hypertension Sister    Hypertension Sister    Diverticulitis Sister    Dementia Sister 17       early onset   Asthma Sister    Diverticulitis Sister    Arthritis Sister        x1   Diverticulitis Sister    Arthritis Sister        neck surgery   Stroke Maternal Grandmother 71   Alcohol abuse Maternal Grandfather    Stroke Paternal Grandmother 48   Heart disease Paternal Grandfather    Healthy  Son    Healthy Son    Healthy Daughter      Current Outpatient Medications:    estradiol (ESTRACE) 0.1 MG/GM vaginal cream, estradiol 0.01% (0.1 mg/gram) vaginal cream  INSERT 1 GRAM VAGINALLY AT BEDTIME FOR 14 DAYS, THEN USE 1/2 GRAM 2 TO 3 TIMES A WEEK, Disp: , Rfl:    estradiol (ESTRACE) 0.5 MG tablet, Take 0.5 mg by mouth once a week. , Disp: , Rfl: 11   fluticasone  (FLONASE ) 50 MCG/ACT nasal spray, SPRAY 2 SPRAYS INTO EACH NOSTRIL EVERY DAY, Disp: 48 mL, Rfl: 1   meloxicam  (MOBIC ) 15 MG tablet, TAKE 1 TABLET BY MOUTH DAILY WITH FOOD FOR 5-7 DAYS, THEN AS NEEDED, Disp: , Rfl:   Review of Systems:  Negative unless indicated in HPI.   Physical Exam: Vitals:   02/14/24 1559  BP: 118/80  Pulse: 79  Temp: 98.2 F (36.8 C)  TempSrc: Oral  SpO2: 98%  Weight: 153 lb 9.6 oz (69.7 kg)    Body mass index is 24.79 kg/m.   Physical Exam Vitals reviewed.  Constitutional:      Appearance: Normal appearance.  HENT:     Right Ear: Tympanic membrane, ear canal and external ear normal.     Left Ear: Tympanic membrane, ear canal and external ear normal.     Mouth/Throat:     Mouth: Mucous membranes are moist.     Pharynx: Oropharynx is clear.  Eyes:     Conjunctiva/sclera: Conjunctivae normal.  Cardiovascular:     Rate and Rhythm: Normal rate and regular rhythm.  Pulmonary:     Effort: Pulmonary effort is normal.     Breath sounds: Normal breath sounds.  Neurological:     Mental Status: She is alert.      Impression and Plan:  Seasonal allergies  -Suspect seasonal allergies.  Advised daily antihistamine.   Time spent:22 minutes reviewing chart, interviewing and examining patient and formulating plan of care.     Tully Theophilus Andrews, MD [facility]

## 2024-03-27 ENCOUNTER — Ambulatory Visit

## 2024-03-27 ENCOUNTER — Encounter: Payer: Self-pay | Admitting: Family Medicine

## 2024-03-27 ENCOUNTER — Ambulatory Visit: Admitting: Family Medicine

## 2024-03-27 VITALS — BP 102/78 | HR 65 | Temp 98.1°F | Ht 66.0 in | Wt 151.7 lb

## 2024-03-27 DIAGNOSIS — J209 Acute bronchitis, unspecified: Secondary | ICD-10-CM | POA: Diagnosis not present

## 2024-03-27 DIAGNOSIS — J189 Pneumonia, unspecified organism: Secondary | ICD-10-CM | POA: Diagnosis not present

## 2024-03-27 MED ORDER — METHYLPREDNISOLONE 4 MG PO TBPK: ORAL_TABLET | ORAL | 0 refills | Status: AC

## 2024-03-27 MED ORDER — AZITHROMYCIN 250 MG PO TABS: ORAL_TABLET | ORAL | 0 refills | Status: AC

## 2024-03-27 MED ORDER — ALBUTEROL SULFATE HFA 108 (90 BASE) MCG/ACT IN AERS: 2.0000 | INHALATION_SPRAY | Freq: Four times a day (QID) | RESPIRATORY_TRACT | 2 refills | Status: AC | PRN

## 2024-03-27 NOTE — Progress Notes (Unsigned)
 Established Patient Office Visit  Subjective   Patient ID: Jordan Khan, female    DOB: [birth year]  Age: 63 y.o. MRN: [id number]  Chief Complaint  Patient presents with   Cough    Intermittent, non-productive cough x3 months, productive with gray sputum x1 week and worse in the mornings    Cough  Discussed the use of AI scribe software for clinical note transcription with the patient, who gave verbal consent to proceed.  History of Present Illness   Jordan Khan is a 63 year old female who presents with persistent cough and postnasal drip.  She reports several months of waxing and waning cough with postnasal drip. Symptoms began with about six weeks of postnasal drip without other upper respiratory symptoms and have persisted since.  She tried over-the-counter Zyrtec and Claritin. Zyrtec caused very deep sleep, which concerned her due to baseline poor sleep, so she stopped it. Claritin for about five days improved symptoms but did not fully resolve them. She now takes Claritin only occasionally and has not used it consistently.  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 Since then Claritin still helps somewhat, but symptoms remain.  She denies fever, chills, sinus pressure, ear pain, sore throat, shortness of breath, or chest pain. She recently noticed new wheezing. She has no lung disease, has never smoked, and has never had asthma.        Current Outpatient Medications  Medication Instructions   albuterol  (VENTOLIN  HFA) 108 (90 Base) MCG/ACT inhaler 2 puffs, Inhalation, Every 6 hours PRN   azithromycin  (ZITHROMAX ) 250 MG tablet Take 2 tablets on day 1, then 1 tablet daily on days 2 through 5   estradiol (ESTRACE) 0.1 MG/GM vaginal cream estradiol 0.01% (0.1 mg/gram) vaginal cream  INSERT 1 GRAM VAGINALLY AT BEDTIME FOR 14 DAYS, THEN USE 1/2 GRAM 2 TO 3 TIMES A WEEK   estradiol (ESTRACE)  0.5 mg, Weekly   meloxicam  (MOBIC ) 15 MG tablet TAKE 1 TABLET BY MOUTH DAILY WITH FOOD FOR 5-7 DAYS, THEN AS NEEDED   methylPREDNISolone  (MEDROL  DOSEPAK) 4 MG TBPK tablet Take package as directed.    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       Objective:     BP 102/78   Pulse 65   Temp 98.1 F (36.7 C) (Oral)   Ht 5' 6 (1.676 m)   Wt 151 lb 11.2 oz (68.8 kg)   SpO2 98%   BMI 24.49 kg/m  {  Vitals History (Optional):23777}  Physical Exam Constitutional:      Appearance: Normal appearance. She is normal weight.  HENT:     Right Ear: Tympanic membrane normal.     Left Ear: Tympanic membrane normal.     Nose: Congestion present.     Right Sinus: No maxillary sinus tenderness.     Left Sinus: No maxillary sinus tenderness.     Mouth/Throat:     Mouth: Mucous membranes are moist.     Pharynx: No oropharyngeal exudate or posterior oropharyngeal erythema.  Cardiovascular:     Rate and Rhythm: Normal rate and regular rhythm.     Heart sounds: Normal heart sounds.   Pulmonary:     Breath sounds: Wheezing present.  Lymphadenopathy:     Cervical: No cervical adenopathy.  Neurological:     Mental Status: She is alert.      No results found for any visits on 03/27/24.  {Labs (Optional):23779}  The 10-year ASCVD risk score (Arnett DK, et al., 2019) is: 2.7%    Assessment & Plan:  Acute bronchitis, unspecified organism -     DG Chest 2 View; Future -     methylPREDNISolone ; Take package as directed.  Dispense: 21 each; Refill: 0 -     Albuterol  Sulfate HFA; Inhale 2 puffs into the lungs every 6 (six) hours as needed for wheezing or shortness of breath.  Dispense: 8 g; Refill: 2 -     Azithromycin ; Take 2 tablets on day 1, then 1 tablet daily on days 2 through 5  Dispense: 6 tablet; Refill: 0   Assessment and Plan    Acute bronchitis New onset wheezing and cough, likely secondary to recent head cold. Differential includes viral bronchitis and allergic bronchitis. No prior history of lung disease or asthma. Wheezing indicates airway inflammation. Discussed potential for allergic bronchitis due to recent head cold and possible viral etiology. Considered bacterial cause due to congestion and recent head cold. Discussed potential side effects of steroids, including mood changes, increased appetite, and sleep disturbances. - Prescribed Medrol  Dosepak (methylprednisolone ) for 6 days to reduce airway inflammation. - Prescribed albuterol  inhaler for as-needed use during coughing fits. - Ordered chest X-ray to rule out pneumonia. - Prescribed azithromycin  (Z-Pak) to address potential bacterial cause and treat atypical pneumonias.  Allergic rhinitis Chronic postnasal drip and sinus congestion, likely due to allergies. Symptoms have waxed and waned over several months. Previous trial of Zyrtec and Claritin provided some relief, but symptoms persist. Discussed potential for developing allergies at any age and the possibility of seasonal or non-seasonal  allergies. Considered allergy testing if symptoms persist after treatment. - Advised to hold off on Claritin until completion of current treatment. - If postnasal drip recurs after treatment, resume Claritin. - Will consider allergy testing if symptoms persist.        No follow-ups on file.    Heron CHRISTELLA Sharper, MD

## 2024-03-28 ENCOUNTER — Encounter: Payer: Self-pay | Admitting: Family Medicine

## 2024-04-02 ENCOUNTER — Ambulatory Visit: Payer: Self-pay | Admitting: Family Medicine

## 2024-04-17 ENCOUNTER — Ambulatory Visit: Admitting: Podiatry
